# Patient Record
Sex: Female | Born: 1945
Health system: Southern US, Community
[De-identification: ages and names within clinical notes are randomized; demographics above are authoritative.]

## PROBLEM LIST (undated history)

## (undated) DIAGNOSIS — M810 Age-related osteoporosis without current pathological fracture: Secondary | ICD-10-CM

## (undated) DIAGNOSIS — M1990 Unspecified osteoarthritis, unspecified site: Secondary | ICD-10-CM

## (undated) DIAGNOSIS — R011 Cardiac murmur, unspecified: Secondary | ICD-10-CM

## (undated) DIAGNOSIS — I10 Essential (primary) hypertension: Secondary | ICD-10-CM

## (undated) DIAGNOSIS — E785 Hyperlipidemia, unspecified: Secondary | ICD-10-CM

## (undated) DIAGNOSIS — R7611 Nonspecific reaction to tuberculin skin test without active tuberculosis: Secondary | ICD-10-CM

## (undated) DIAGNOSIS — J45909 Unspecified asthma, uncomplicated: Secondary | ICD-10-CM

## (undated) DIAGNOSIS — E039 Hypothyroidism, unspecified: Secondary | ICD-10-CM

## (undated) DIAGNOSIS — B019 Varicella without complication: Secondary | ICD-10-CM

## (undated) HISTORY — DX: Nonspecific reaction to tuberculin skin test without active tuberculosis: R76.11

## (undated) HISTORY — DX: Essential (primary) hypertension: I10

## (undated) HISTORY — PX: BREAST BIOPSY: SHX20

## (undated) HISTORY — DX: Hyperlipidemia, unspecified: E78.5

## (undated) HISTORY — DX: Cardiac murmur, unspecified: R01.1

## (undated) HISTORY — DX: Age-related osteoporosis without current pathological fracture: M81.0

## (undated) HISTORY — DX: Hypothyroidism, unspecified: E03.9

## (undated) HISTORY — DX: Unspecified asthma, uncomplicated: J45.909

## (undated) HISTORY — DX: Unspecified osteoarthritis, unspecified site: M19.90

## (undated) HISTORY — DX: Varicella without complication: B01.9

---

## 2000-09-26 HISTORY — PX: FOOT SURGERY: SHX648

## 2014-07-10 LAB — HM DIABETES EYE EXAM

## 2015-01-12 LAB — HM COLONOSCOPY

## 2015-08-14 LAB — HM DIABETES EYE EXAM

## 2016-10-13 DIAGNOSIS — E039 Hypothyroidism, unspecified: Secondary | ICD-10-CM | POA: Diagnosis not present

## 2016-10-13 DIAGNOSIS — I10 Essential (primary) hypertension: Secondary | ICD-10-CM | POA: Diagnosis not present

## 2016-10-13 DIAGNOSIS — E7800 Pure hypercholesterolemia, unspecified: Secondary | ICD-10-CM | POA: Diagnosis not present

## 2016-10-13 DIAGNOSIS — E119 Type 2 diabetes mellitus without complications: Secondary | ICD-10-CM | POA: Diagnosis not present

## 2016-10-27 DIAGNOSIS — E7800 Pure hypercholesterolemia, unspecified: Secondary | ICD-10-CM | POA: Diagnosis not present

## 2016-10-27 DIAGNOSIS — Z79899 Other long term (current) drug therapy: Secondary | ICD-10-CM | POA: Diagnosis not present

## 2016-10-27 DIAGNOSIS — E119 Type 2 diabetes mellitus without complications: Secondary | ICD-10-CM | POA: Diagnosis not present

## 2016-11-17 DIAGNOSIS — E119 Type 2 diabetes mellitus without complications: Secondary | ICD-10-CM | POA: Diagnosis not present

## 2016-11-17 DIAGNOSIS — H25813 Combined forms of age-related cataract, bilateral: Secondary | ICD-10-CM | POA: Diagnosis not present

## 2016-11-17 LAB — HM DIABETES EYE EXAM

## 2016-12-26 DIAGNOSIS — L820 Inflamed seborrheic keratosis: Secondary | ICD-10-CM | POA: Diagnosis not present

## 2016-12-26 DIAGNOSIS — Z08 Encounter for follow-up examination after completed treatment for malignant neoplasm: Secondary | ICD-10-CM | POA: Diagnosis not present

## 2016-12-26 DIAGNOSIS — L814 Other melanin hyperpigmentation: Secondary | ICD-10-CM | POA: Diagnosis not present

## 2016-12-26 DIAGNOSIS — D2371 Other benign neoplasm of skin of right lower limb, including hip: Secondary | ICD-10-CM | POA: Diagnosis not present

## 2016-12-26 DIAGNOSIS — L821 Other seborrheic keratosis: Secondary | ICD-10-CM | POA: Diagnosis not present

## 2016-12-26 DIAGNOSIS — L538 Other specified erythematous conditions: Secondary | ICD-10-CM | POA: Diagnosis not present

## 2016-12-26 DIAGNOSIS — R58 Hemorrhage, not elsewhere classified: Secondary | ICD-10-CM | POA: Diagnosis not present

## 2017-01-24 DIAGNOSIS — Z1231 Encounter for screening mammogram for malignant neoplasm of breast: Secondary | ICD-10-CM | POA: Diagnosis not present

## 2017-03-27 DIAGNOSIS — E7800 Pure hypercholesterolemia, unspecified: Secondary | ICD-10-CM | POA: Diagnosis not present

## 2017-03-27 DIAGNOSIS — Z79899 Other long term (current) drug therapy: Secondary | ICD-10-CM | POA: Diagnosis not present

## 2017-03-27 DIAGNOSIS — E119 Type 2 diabetes mellitus without complications: Secondary | ICD-10-CM | POA: Diagnosis not present

## 2017-03-27 DIAGNOSIS — E039 Hypothyroidism, unspecified: Secondary | ICD-10-CM | POA: Diagnosis not present

## 2017-03-27 DIAGNOSIS — I10 Essential (primary) hypertension: Secondary | ICD-10-CM | POA: Diagnosis not present

## 2017-03-27 DIAGNOSIS — Z6841 Body Mass Index (BMI) 40.0 and over, adult: Secondary | ICD-10-CM | POA: Diagnosis not present

## 2017-03-27 LAB — LIPID PANEL
Cholesterol: 179 (ref 0–200)
HDL: 60 (ref 35–70)
LDL Cholesterol: 76
Triglycerides: 214 — AB (ref 40–160)

## 2017-03-27 LAB — HEPATIC FUNCTION PANEL
ALT: 26 (ref 7–35)
AST: 19 (ref 13–35)
Alkaline Phosphatase: 87 (ref 25–125)
Bilirubin, Total: 0.2

## 2017-03-27 LAB — BASIC METABOLIC PANEL
BUN: 37 — AB (ref 4–21)
CREATININE: 1.1 (ref 0.5–1.1)
GLUCOSE: 156
Potassium: 4.4 (ref 3.4–5.3)
SODIUM: 141 (ref 137–147)

## 2017-03-27 LAB — CBC AND DIFFERENTIAL
HCT: 37 (ref 36–46)
HEMOGLOBIN: 11.7 — AB (ref 12.0–16.0)
NEUTROS ABS: 6
Platelets: 428 — AB (ref 150–399)
WBC: 9.5

## 2017-03-27 LAB — HEMOGLOBIN A1C: HEMOGLOBIN A1C: 7.4

## 2017-03-27 LAB — TSH: TSH: 0.68 (ref 0.41–5.90)

## 2017-04-04 DIAGNOSIS — M8580 Other specified disorders of bone density and structure, unspecified site: Secondary | ICD-10-CM | POA: Diagnosis not present

## 2017-04-04 DIAGNOSIS — Z0000 Encounter for general adult medical examination without abnormal findings: Secondary | ICD-10-CM | POA: Diagnosis not present

## 2017-04-04 DIAGNOSIS — Z1212 Encounter for screening for malignant neoplasm of rectum: Secondary | ICD-10-CM | POA: Diagnosis not present

## 2017-04-04 DIAGNOSIS — N952 Postmenopausal atrophic vaginitis: Secondary | ICD-10-CM | POA: Diagnosis not present

## 2017-05-03 ENCOUNTER — Ambulatory Visit (INDEPENDENT_AMBULATORY_CARE_PROVIDER_SITE_OTHER): Payer: Medicare Other | Admitting: Adult Health

## 2017-05-03 ENCOUNTER — Encounter: Payer: Self-pay | Admitting: Adult Health

## 2017-05-03 VITALS — BP 128/66 | Temp 98.6°F | Ht 62.25 in | Wt 254.0 lb

## 2017-05-03 DIAGNOSIS — Z7689 Persons encountering health services in other specified circumstances: Secondary | ICD-10-CM

## 2017-05-03 DIAGNOSIS — B019 Varicella without complication: Secondary | ICD-10-CM | POA: Insufficient documentation

## 2017-05-03 DIAGNOSIS — I10 Essential (primary) hypertension: Secondary | ICD-10-CM | POA: Insufficient documentation

## 2017-05-03 DIAGNOSIS — E039 Hypothyroidism, unspecified: Secondary | ICD-10-CM | POA: Diagnosis not present

## 2017-05-03 DIAGNOSIS — E785 Hyperlipidemia, unspecified: Secondary | ICD-10-CM | POA: Insufficient documentation

## 2017-05-03 DIAGNOSIS — E782 Mixed hyperlipidemia: Secondary | ICD-10-CM

## 2017-05-03 DIAGNOSIS — M810 Age-related osteoporosis without current pathological fracture: Secondary | ICD-10-CM | POA: Insufficient documentation

## 2017-05-03 DIAGNOSIS — R7611 Nonspecific reaction to tuberculin skin test without active tuberculosis: Secondary | ICD-10-CM | POA: Insufficient documentation

## 2017-05-03 DIAGNOSIS — R011 Cardiac murmur, unspecified: Secondary | ICD-10-CM | POA: Insufficient documentation

## 2017-05-03 NOTE — Progress Notes (Signed)
Patient presents to clinic today to establish care. She is a pleasant 71 year old female who  has a past medical history of Arthritis; Asthma; Chicken pox; Heart murmur; Hyperlipidemia; Hypertension; Hypothyroidism; Osteoporosis; and Positive TB test.  Her last physical was in July 2018.   She has recently moved from [place] with her husband, whom she is the the main caregiver for. She moved so that they could be closer to her son.    Acute Concerns: Establish Care    Chronic Issues: Diabetes - last A1c was 7.4. She currently takes Glipizide 10 mg and Metformin 1000mg  BID   Hyperlipidemia - Controlled with Lipitor  10 mg   Hypertension - She takes lisinopril/HCTZ 20-25mg  and Norvasc 10 mg daily. She is well controlled on this combination   Hypothyroidism - Is controlled on 8 53mcg   Health Maintenance: Dental --Routine Care Vision --Routine Care Immunizations -- UTD  Colonoscopy -- 09/2014 - 5 year plan  Mammogram -- 01/24/2017  PAP -- 7/17  Bone Density -- ? Diet: Tries to eat healthy  Exercise: She is the main caregiver of her husband with Alzheimer's so she does not get a lot of time to exercise    Past Medical History:  Diagnosis Date  . Arthritis   . Asthma   . Chicken pox   . Heart murmur   . Hyperlipidemia   . Hypertension   . Hypothyroidism   . Osteoporosis   . Positive TB test     Past Surgical History:  Procedure Laterality Date  . FOOT SURGERY Left 2002    No current outpatient prescriptions on file prior to visit.   No current facility-administered medications on file prior to visit.     Allergies  Allergen Reactions  . Codeine Nausea And Vomiting    Family History  Problem Relation Age of Onset  . Heart disease Mother   . Stroke Mother   . Hyperlipidemia Mother   . Pulmonary fibrosis Father     Social History   Social History  . Marital status: Married    Spouse name: N/A  . Number of children: N/A  . Years of education: N/A    Occupational History  . Not on file.   Social History Main Topics  . Smoking status: Never Smoker  . Smokeless tobacco: Never Used  . Alcohol use No  . Drug use: No  . Sexual activity: Not on file   Other Topics Concern  . Not on file   Social History Narrative   Retired Therapist, sports        Review of Systems  Constitutional: Negative.   HENT: Negative.   Eyes: Negative.   Respiratory: Negative.   Cardiovascular: Negative.   Gastrointestinal: Negative.   Genitourinary: Negative.   Musculoskeletal: Negative.   Skin: Negative.   Neurological: Negative.   Endo/Heme/Allergies: Negative.   Psychiatric/Behavioral: Negative.   All other systems reviewed and are negative.   BP 128/66 (BP Location: Right Arm)   Temp 98.6 F (37 C) (Oral)   Ht 5' 2.25" (1.581 m)   Wt 254 lb (115.2 kg)   BMI 46.08 kg/m   Physical Exam  Constitutional: She is oriented to person, place, and time and well-developed, well-nourished, and in no distress. No distress.  Obese   Cardiovascular: Normal rate, regular rhythm and intact distal pulses.  Exam reveals no gallop and no friction rub.   Murmur (heard best at  right sternal border ) heard. Pulmonary/Chest: Effort normal and  breath sounds normal. No respiratory distress. She has no wheezes. She has no rales. She exhibits no tenderness.  Neurological: She is alert and oriented to person, place, and time. Gait normal. GCS score is 15.  Skin: Skin is warm and dry. No rash noted. She is not diaphoretic. No erythema. No pallor.  Psychiatric: Mood, memory, affect and judgment normal.  Nursing note and vitals reviewed.  Assessment/Plan:  1. Encounter to establish care - Follow up for CPE in July 2019  - Encouraged to start exercising and following a diabetic diet.  - Follow up sooner for any acute issues.   2. Hypothyroidism, unspecified type - Well controlled. No change in medication at this time   3. Essential hypertension - Well controlled.  -  No change in medication at this time   4. Mixed hyperlipidemia - Continue with Lipitor  - Will retest at [place], NP

## 2017-05-03 NOTE — Patient Instructions (Signed)
It was a pleasure meeting you today   Please follow up with me in July for your physical and in 3 months for a diabetes check up.   If you need anything in the meantime, please let me know

## 2017-05-09 ENCOUNTER — Encounter: Payer: Self-pay | Admitting: Family Medicine

## 2017-05-18 ENCOUNTER — Encounter: Payer: Self-pay | Admitting: Family Medicine

## 2017-08-02 ENCOUNTER — Encounter: Payer: Self-pay | Admitting: Adult Health

## 2017-08-02 ENCOUNTER — Ambulatory Visit (INDEPENDENT_AMBULATORY_CARE_PROVIDER_SITE_OTHER): Payer: Medicare Other | Admitting: Adult Health

## 2017-08-02 VITALS — BP 138/72 | Temp 98.3°F | Wt 250.0 lb

## 2017-08-02 DIAGNOSIS — Z760 Encounter for issue of repeat prescription: Secondary | ICD-10-CM | POA: Diagnosis not present

## 2017-08-02 DIAGNOSIS — E119 Type 2 diabetes mellitus without complications: Secondary | ICD-10-CM

## 2017-08-02 LAB — POCT GLYCOSYLATED HEMOGLOBIN (HGB A1C): HEMOGLOBIN A1C: 6.9

## 2017-08-02 MED ORDER — METFORMIN HCL 1000 MG PO TABS: 1000.0000 mg | ORAL_TABLET | Freq: Two times a day (BID) | ORAL | 1 refills | Status: DC

## 2017-08-02 MED ORDER — ALBUTEROL SULFATE HFA 108 (90 BASE) MCG/ACT IN AERS: 1.0000 | INHALATION_SPRAY | Freq: Four times a day (QID) | RESPIRATORY_TRACT | 3 refills | Status: DC | PRN

## 2017-08-02 NOTE — Progress Notes (Signed)
Subjective:    Patient ID: Caroline Suarez, female    DOB: [birth year], 71 y.o.   MRN: [id number]  HPI  71 year old female who  has a past medical history of Arthritis, Asthma, Chicken pox, Heart murmur, Hyperlipidemia, Hypertension, Hypothyroidism, Osteoporosis, and Positive TB test. She presents to the office today for follow up regarding diabetes mellitus. Her last A1c in July 2018 was 7.4. She is currently maintained on glipizide 10 mg and metformin 1000 mg BID   She does not check her blood sugars at home. She has been working on her diet   She has no acute issues today   Review of Systems See HPI   Past Medical History:  Diagnosis Date  . Arthritis   . Asthma   . Chicken pox   . Heart murmur   . Hyperlipidemia   . Hypertension   . Hypothyroidism   . Osteoporosis   . Positive TB test     Social History   Socioeconomic History  . Marital status: Married    Spouse name: Not on file  . Number of children: Not on file  . Years of education: Not on file  . Highest education level: Not on file  Social Needs  . Financial resource strain: Not on file  . Food insecurity - worry: Not on file  . Food insecurity - inability: Not on file  . Transportation needs - medical: Not on file  . Transportation needs - non-medical: Not on file  Occupational History  . Not on file  Tobacco Use  . Smoking status: Never Smoker  . Smokeless tobacco: Never Used  Substance and Sexual Activity  . Alcohol use: No  . Drug use: No  . Sexual activity: Not on file  Other Topics Concern  . Not on file  Social History Narrative   Retired Therapist, sports     Past Surgical History:  Procedure Laterality Date  . BREAST BIOPSY     multiple. None came back positive   . FOOT SURGERY Left 2002    Family History  Problem Relation Age of Onset  . Heart disease Mother   . Stroke Mother   . Hyperlipidemia Mother   . Pulmonary fibrosis Father     Allergies  Allergen Reactions  . Codeine Nausea And  Vomiting    Current Outpatient Medications on File Prior to Visit  Medication Sig Dispense Refill  . Albuterol Sulfate (PROAIR HFA IN) Inhale into the lungs.    Marland Kitchen alendronate (FOSAMAX) 70 MG tablet Take 70 mg by mouth once a week. Take with a full glass of water on an empty stomach.    Marland Kitchen amLODipine (NORVASC) 10 MG tablet Take 10 mg by mouth daily.    Marland Kitchen aspirin EC 81 MG tablet Take 81 mg by mouth daily.    Marland Kitchen atorvastatin (LIPITOR) 10 MG tablet Take 10 mg by mouth at bedtime.    . fexofenadine (ALLEGRA) 180 MG tablet Take 180 mg by mouth daily.    Marland Kitchen glipiZIDE (GLUCOTROL) 10 MG tablet Take 10 mg by mouth 2 (two) times daily before a meal.    . levothyroxine (SYNTHROID, LEVOTHROID) 88 MCG tablet Take 88 mcg by mouth daily before breakfast.    . lisinopril-hydrochlorothiazide (PRINZIDE,ZESTORETIC) 20-25 MG tablet Take 1 tablet by mouth daily.    . metFORMIN (GLUCOPHAGE) 1000 MG tablet Take 1,000 mg by mouth 2 (two) times daily with a meal.    . thiamine 100 MG tablet Take 100 mg  by mouth daily.    . valACYclovir (VALTREX) 1000 MG tablet TK 2 TS PO BID  3   No current facility-administered medications on file prior to visit.     BP 138/72 (BP Location: Left Arm)   Temp 98.3 F (36.8 C) (Oral)   Wt 250 lb (113.4 kg)   BMI 45.36 kg/m       Objective:   Physical Exam  Constitutional: She is oriented to person, place, and time. She appears well-developed and well-nourished. No distress.  Cardiovascular: Normal rate, regular rhythm, normal heart sounds and intact distal pulses. Exam reveals no gallop and no friction rub.  No murmur heard. Pulmonary/Chest: Effort normal and breath sounds normal. No respiratory distress. She has no wheezes. She has no rales. She exhibits no tenderness.  Neurological: She is alert and oriented to person, place, and time.  Skin: Skin is warm and dry. No rash noted. She is not diaphoretic. No erythema. No pallor.  Psychiatric: She has a normal mood and affect.  Her behavior is normal. Judgment and thought content normal.  Nursing note and vitals reviewed.     Assessment & Plan:  1. Type 2 diabetes mellitus without complication, without long-term current use of insulin (HCC)  - metFORMIN (GLUCOPHAGE) 1000 MG tablet; Take 1 tablet (1,000 mg total) 2 (two) times daily with a meal by mouth.  Dispense: 180 tablet; Refill: 1 - POC HgB A1c - 6.9 . Has improved from 7.4 - No change in medication at this time.  - I am ok with her following up in July for her physical exam unless she runs into any problems   2. Medication refill  - albuterol (PROAIR HFA) 108 (90 Base) MCG/ACT inhaler; Inhale 1-2 puffs every 6 (six) hours as needed into the lungs.  Dispense: 6.7 g; Refill: 3

## 2017-10-10 DIAGNOSIS — H25813 Combined forms of age-related cataract, bilateral: Secondary | ICD-10-CM | POA: Diagnosis not present

## 2017-12-11 DIAGNOSIS — D225 Melanocytic nevi of trunk: Secondary | ICD-10-CM | POA: Diagnosis not present

## 2017-12-11 DIAGNOSIS — D0462 Carcinoma in situ of skin of left upper limb, including shoulder: Secondary | ICD-10-CM | POA: Diagnosis not present

## 2017-12-11 DIAGNOSIS — L821 Other seborrheic keratosis: Secondary | ICD-10-CM | POA: Diagnosis not present

## 2017-12-11 DIAGNOSIS — L814 Other melanin hyperpigmentation: Secondary | ICD-10-CM | POA: Diagnosis not present

## 2017-12-11 DIAGNOSIS — Z85828 Personal history of other malignant neoplasm of skin: Secondary | ICD-10-CM | POA: Diagnosis not present

## 2017-12-22 DIAGNOSIS — D0462 Carcinoma in situ of skin of left upper limb, including shoulder: Secondary | ICD-10-CM | POA: Diagnosis not present

## 2017-12-22 DIAGNOSIS — Z85828 Personal history of other malignant neoplasm of skin: Secondary | ICD-10-CM | POA: Diagnosis not present

## 2018-02-28 ENCOUNTER — Other Ambulatory Visit: Payer: Self-pay | Admitting: Adult Health

## 2018-02-28 DIAGNOSIS — E119 Type 2 diabetes mellitus without complications: Secondary | ICD-10-CM

## 2018-02-28 NOTE — Telephone Encounter (Signed)
Ok to refill for 90 days, but needs office visit to check her A1c

## 2018-03-01 NOTE — Telephone Encounter (Signed)
Sent to the pharmacy as instructed.  Left a detailed message on voicemail instructing the pt to call back and schedule A1C follow up.

## 2018-03-16 ENCOUNTER — Other Ambulatory Visit: Payer: Self-pay | Admitting: Adult Health

## 2018-03-16 NOTE — Telephone Encounter (Signed)
Sent to the pharmacy by e-scribe. 

## 2018-03-16 NOTE — Telephone Encounter (Signed)
Caroline Suarez, you have not prescribed either of these medications in the past.  Please advise.

## 2018-03-16 NOTE — Telephone Encounter (Signed)
Ok to refill 90 +1  

## 2018-03-27 ENCOUNTER — Other Ambulatory Visit: Payer: Self-pay | Admitting: Adult Health

## 2018-03-27 NOTE — Telephone Encounter (Signed)
Sent to the pharmacy by e-scribe for 90 days.  Pt has upcoming appt on 04/03/18.

## 2018-04-03 ENCOUNTER — Ambulatory Visit (INDEPENDENT_AMBULATORY_CARE_PROVIDER_SITE_OTHER): Payer: Medicare Other

## 2018-04-03 ENCOUNTER — Ambulatory Visit (INDEPENDENT_AMBULATORY_CARE_PROVIDER_SITE_OTHER): Payer: Medicare Other | Admitting: Adult Health

## 2018-04-03 ENCOUNTER — Encounter: Payer: Self-pay | Admitting: Adult Health

## 2018-04-03 VITALS — BP 120/76 | Ht 62.0 in | Wt 243.0 lb

## 2018-04-03 VITALS — BP 120/76 | Temp 98.2°F | Ht 62.0 in | Wt 243.0 lb

## 2018-04-03 DIAGNOSIS — I10 Essential (primary) hypertension: Secondary | ICD-10-CM

## 2018-04-03 DIAGNOSIS — F4321 Adjustment disorder with depressed mood: Secondary | ICD-10-CM

## 2018-04-03 DIAGNOSIS — Z0000 Encounter for general adult medical examination without abnormal findings: Secondary | ICD-10-CM | POA: Diagnosis not present

## 2018-04-03 DIAGNOSIS — Z1159 Encounter for screening for other viral diseases: Secondary | ICD-10-CM | POA: Diagnosis not present

## 2018-04-03 DIAGNOSIS — E119 Type 2 diabetes mellitus without complications: Secondary | ICD-10-CM | POA: Diagnosis not present

## 2018-04-03 DIAGNOSIS — E039 Hypothyroidism, unspecified: Secondary | ICD-10-CM

## 2018-04-03 DIAGNOSIS — Z23 Encounter for immunization: Secondary | ICD-10-CM | POA: Diagnosis not present

## 2018-04-03 DIAGNOSIS — E782 Mixed hyperlipidemia: Secondary | ICD-10-CM | POA: Diagnosis not present

## 2018-04-03 DIAGNOSIS — Z114 Encounter for screening for human immunodeficiency virus [HIV]: Secondary | ICD-10-CM | POA: Diagnosis not present

## 2018-04-03 LAB — CBC WITH DIFFERENTIAL/PLATELET
BASOS PCT: 1 % (ref 0.0–3.0)
Basophils Absolute: 0.1 10*3/uL (ref 0.0–0.1)
EOS PCT: 1.6 % (ref 0.0–5.0)
Eosinophils Absolute: 0.2 10*3/uL (ref 0.0–0.7)
HEMATOCRIT: 35.8 % — AB (ref 36.0–46.0)
HEMOGLOBIN: 11.9 g/dL — AB (ref 12.0–15.0)
LYMPHS PCT: 24.6 % (ref 12.0–46.0)
Lymphs Abs: 2.3 10*3/uL (ref 0.7–4.0)
MCHC: 33.4 g/dL (ref 30.0–36.0)
MCV: 93.3 fl (ref 78.0–100.0)
MONOS PCT: 5.6 % (ref 3.0–12.0)
Monocytes Absolute: 0.5 10*3/uL (ref 0.1–1.0)
Neutro Abs: 6.4 10*3/uL (ref 1.4–7.7)
Neutrophils Relative %: 67.2 % (ref 43.0–77.0)
Platelets: 447 10*3/uL — ABNORMAL HIGH (ref 150.0–400.0)
RBC: 3.84 Mil/uL — ABNORMAL LOW (ref 3.87–5.11)
RDW: 13.8 % (ref 11.5–15.5)
WBC: 9.5 10*3/uL (ref 4.0–10.5)

## 2018-04-03 LAB — BASIC METABOLIC PANEL
BUN: 32 mg/dL — ABNORMAL HIGH (ref 6–23)
CHLORIDE: 106 meq/L (ref 96–112)
CO2: 26 mEq/L (ref 19–32)
Calcium: 10 mg/dL (ref 8.4–10.5)
Creatinine, Ser: 1.04 mg/dL (ref 0.40–1.20)
GFR: 55.35 mL/min — ABNORMAL LOW (ref >60.00)
Glucose, Bld: 140 mg/dL — ABNORMAL HIGH (ref 70–99)
POTASSIUM: 4.8 meq/L (ref 3.5–5.1)
Sodium: 141 mEq/L (ref 135–145)

## 2018-04-03 LAB — HEMOGLOBIN A1C: HEMOGLOBIN A1C: 7.1 % — AB (ref 4.6–6.5)

## 2018-04-03 LAB — HEPATIC FUNCTION PANEL
ALT: 15 U/L (ref 0–35)
AST: 14 U/L (ref 0–37)
Albumin: 4.3 g/dL (ref 3.5–5.2)
Alkaline Phosphatase: 72 U/L (ref 39–117)
BILIRUBIN DIRECT: 0.1 mg/dL (ref 0.0–0.3)
TOTAL PROTEIN: 7.2 g/dL (ref 6.0–8.3)
Total Bilirubin: 0.3 mg/dL (ref 0.2–1.2)

## 2018-04-03 LAB — LIPID PANEL
CHOL/HDL RATIO: 3
CHOLESTEROL: 203 mg/dL — AB (ref 0–200)
HDL: 59.8 mg/dL (ref >39.00)
LDL Cholesterol: 111 mg/dL — ABNORMAL HIGH (ref 0–99)
NonHDL: 142.9
Triglycerides: 160 mg/dL — ABNORMAL HIGH (ref 0.0–149.0)
VLDL: 32 mg/dL (ref 0.0–40.0)

## 2018-04-03 LAB — TSH: TSH: 0.9 u[IU]/mL (ref 0.35–4.50)

## 2018-04-03 NOTE — Patient Instructions (Addendum)
Caroline Suarez , Thank you for taking time to come for your Medicare Wellness Visit. I appreciate your ongoing commitment to your health goals. Please review the following plan we discussed and let me know if I can assist you in the future.   You can ask your OB GYN doctor dr. Julien Girt if they are following your Dexa or bone density  You bone density was done July 2016; and started on medication the same year  Gyn will fup mammogram to be done on Friday  [facility] - can assist with hearing aid x 1  No reviews  [facility]  [id number]  [phone]  [email or web address]    These are the goals we discussed: Goals    . Exercise 150 min/wk Moderate Activity     Join water aerobics class !        This is a list of the screening recommended for you and due dates:  Health Maintenance  Topic Date Due  .  Hepatitis C: One time screening is recommended by Center for Disease Control  (CDC) for  adults born from [birth year] through 1965.   [birth year]  . Complete foot exam   02/28/1956  . Hemoglobin A1C  01/30/2018  . Flu Shot  04/26/2018  . Eye exam for diabetics  09/26/2018  . Mammogram  01/25/2019  . Tetanus Vaccine  10/12/2022  . Colon Cancer Screening  10/23/2024  . DEXA scan (bone density measurement)  Completed  . Pneumonia vaccines  Completed   '  Fall Prevention in the Home Falls can cause injuries. They can happen to people of all ages. There are many things you can do to make your home safe and to help prevent falls. What can I do on the outside of my home?  Regularly fix the edges of walkways and driveways and fix any cracks.  Remove anything that might make you trip as you walk through a door, such as a raised step or threshold.  Trim any bushes or trees on the path to your home.  Use bright outdoor lighting.  Clear any walking paths  of anything that might make someone trip, such as rocks or tools.  Regularly check to see if handrails are loose or broken. Make sure that both sides of any steps have handrails.  Any raised decks and porches should have guardrails on the edges.  Have any leaves, snow, or ice cleared regularly.  Use sand or salt on walking paths during winter.  Clean up any spills in your garage right away. This includes oil or grease spills. What can I do in the bathroom?  Use night lights.  Install grab bars by the toilet and in the tub and shower. Do not use towel bars as grab bars.  Use non-skid mats or decals in the tub or shower.  If you need to sit down in the shower, use a plastic, non-slip stool.  Keep the floor dry. Clean up any water that spills on the floor as soon as it happens.  Remove soap buildup in the tub or shower regularly.  Attach bath mats securely with double-sided non-slip rug tape.  Do not have throw rugs and other things on the floor that can make you trip. What can I do in the bedroom?  Use night lights.  Make sure that you have a light by your bed that is easy to reach.  Do not use any sheets or blankets  that are too big for your bed. They should not hang down onto the floor.  Have a firm chair that has side arms. You can use this for support while you get dressed.  Do not have throw rugs and other things on the floor that can make you trip. What can I do in the kitchen?  Clean up any spills right away.  Avoid walking on wet floors.  Keep items that you use a lot in easy-to-reach places.  If you need to reach something above you, use a strong step stool that has a grab bar.  Keep electrical cords out of the way.  Do not use floor polish or wax that makes floors slippery. If you must use wax, use non-skid floor wax.  Do not have throw rugs and other things on the floor that can make you trip. What can I do with my stairs?  Do not leave any items on  the stairs.  Make sure that there are handrails on both sides of the stairs and use them. Fix handrails that are broken or loose. Make sure that handrails are as long as the stairways.  Check any carpeting to make sure that it is firmly attached to the stairs. Fix any carpet that is loose or worn.  Avoid having throw rugs at the top or bottom of the stairs. If you do have throw rugs, attach them to the floor with carpet tape.  Make sure that you have a light switch at the top of the stairs and the bottom of the stairs. If you do not have them, ask someone to add them for you. What else can I do to help prevent falls?  Wear shoes that: ? Do not have high heels. ? Have rubber bottoms. ? Are comfortable and fit you well. ? Are closed at the toe. Do not wear sandals.  If you use a stepladder: ? Make sure that it is fully opened. Do not climb a closed stepladder. ? Make sure that both sides of the stepladder are locked into place. ? Ask someone to hold it for you, if possible.  Clearly mark and make sure that you can see: ? Any grab bars or handrails. ? First and last steps. ? Where the edge of each step is.  Use tools that help you move around (mobility aids) if they are needed. These include: ? Canes. ? Walkers. ? Scooters. ? Crutches.  Turn on the lights when you go into a dark area. Replace any light bulbs as soon as they burn out.  Set up your furniture so you have a clear path. Avoid moving your furniture around.  If any of your floors are uneven, fix them.  If there are any pets around you, be aware of where they are.  Review your medicines with your doctor. Some medicines can make you feel dizzy. This can increase your chance of falling. Ask your doctor what other things that you can do to help prevent falls. This information is not intended to replace advice given to you by your health care provider. Make sure you discuss any questions you have with your health care  provider. Document Released: 07/09/2009 Document Revised: 02/18/2016 Document Reviewed: 10/17/2014 Elsevier Interactive Patient Education  2018 [place] Maintenance, Female Adopting a healthy lifestyle and getting preventive care can go a long way to promote health and wellness. Talk with your health care provider about what schedule of regular examinations is right for you. This  is a good chance for you to check in with your provider about disease prevention and staying healthy. In between checkups, there are plenty of things you can do on your own. Experts have done a lot of research about which lifestyle changes and preventive measures are most likely to keep you healthy. Ask your health care provider for more information. Weight and diet Eat a healthy diet  Be sure to include plenty of vegetables, fruits, low-fat dairy products, and lean protein.  Do not eat a lot of foods high in solid fats, added sugars, or salt.  Get regular exercise. This is one of the most important things you can do for your health. ? Most adults should exercise for at least 150 minutes each week. The exercise should increase your heart rate and make you sweat (moderate-intensity exercise). ? Most adults should also do strengthening exercises at least twice a week. This is in addition to the moderate-intensity exercise.  Maintain a healthy weight  Body mass index (BMI) is a measurement that can be used to identify possible weight problems. It estimates body fat based on height and weight. Your health care provider can help determine your BMI and help you achieve or maintain a healthy weight.  For females 83 years of age and older: ? A BMI below 18.5 is considered underweight. ? A BMI of 18.5 to 24.9 is normal. ? A BMI of 25 to 29.9 is considered overweight. ? A BMI of 30 and above is considered obese.  Watch levels of cholesterol and blood lipids  You should start having your blood tested for  lipids and cholesterol at 72 years of age, then have this test every 5 years.  You may need to have your cholesterol levels checked more often if: ? Your lipid or cholesterol levels are high. ? You are older than 72 years of age. ? You are at high risk for heart disease.  Cancer screening Lung Cancer  Lung cancer screening is recommended for adults 59-75 years old who are at high risk for lung cancer because of a history of smoking.  A yearly low-dose CT scan of the lungs is recommended for people who: ? Currently smoke. ? Have quit within the past 15 years. ? Have at least a 30-pack-year history of smoking. A pack year is smoking an average of one pack of cigarettes a day for 1 year.  Yearly screening should continue until it has been 15 years since you quit.  Yearly screening should stop if you develop a health problem that would prevent you from having lung cancer treatment.  Breast Cancer  Practice breast self-awareness. This means understanding how your breasts normally appear and feel.  It also means doing regular breast self-exams. Let your health care provider know about any changes, no matter how small.  If you are in your 20s or 30s, you should have a clinical breast exam (CBE) by a health care provider every 1-3 years as part of a regular health exam.  If you are 72 or older, have a CBE every year. Also consider having a breast X-ray (mammogram) every year.  If you have a family history of breast cancer, talk to your health care provider about genetic screening.  If you are at high risk for breast cancer, talk to your health care provider about having an MRI and a mammogram every year.  Breast cancer gene (BRCA) assessment is recommended for women who have family members with BRCA-related cancers. BRCA-related cancers include: ?  Breast. ? Ovarian. ? Tubal. ? Peritoneal cancers.  Results of the assessment will determine the need for genetic counseling and BRCA1 and  BRCA2 testing.  Cervical Cancer Your health care provider may recommend that you be screened regularly for cancer of the pelvic organs (ovaries, uterus, and vagina). This screening involves a pelvic examination, including checking for microscopic changes to the surface of your cervix (Pap test). You may be encouraged to have this screening done every 3 years, beginning at age 49.  For women ages 62-65, health care providers may recommend pelvic exams and Pap testing every 3 years, or they may recommend the Pap and pelvic exam, combined with testing for human papilloma virus (HPV), every 5 years. Some types of HPV increase your risk of cervical cancer. Testing for HPV may also be done on women of any age with unclear Pap test results.  Other health care providers may not recommend any screening for nonpregnant women who are considered low risk for pelvic cancer and who do not have symptoms. Ask your health care provider if a screening pelvic exam is right for you.  If you have had past treatment for cervical cancer or a condition that could lead to cancer, you need Pap tests and screening for cancer for at least 20 years after your treatment. If Pap tests have been discontinued, your risk factors (such as having a new sexual partner) need to be reassessed to determine if screening should resume. Some women have medical problems that increase the chance of getting cervical cancer. In these cases, your health care provider may recommend more frequent screening and Pap tests.  Colorectal Cancer  This type of cancer can be detected and often prevented.  Routine colorectal cancer screening usually begins at 71 years of age and continues through 72 years of age.  Your health care provider may recommend screening at an earlier age if you have risk factors for colon cancer.  Your health care provider may also recommend using home test kits to check for hidden blood in the stool.  A small camera at the  end of a tube can be used to examine your colon directly (sigmoidoscopy or colonoscopy). This is done to check for the earliest forms of colorectal cancer.  Routine screening usually begins at age 58.  Direct examination of the colon should be repeated every 5-10 years through 72 years of age. However, you may need to be screened more often if early forms of precancerous polyps or small growths are found.  Skin Cancer  Check your skin from head to toe regularly.  Tell your health care provider about any new moles or changes in moles, especially if there is a change in a mole's shape or color.  Also tell your health care provider if you have a mole that is larger than the size of a pencil eraser.  Always use sunscreen. Apply sunscreen liberally and repeatedly throughout the day.  Protect yourself by wearing long sleeves, pants, a wide-brimmed hat, and sunglasses whenever you are outside.  Heart disease, diabetes, and high blood pressure  High blood pressure causes heart disease and increases the risk of stroke. High blood pressure is more likely to develop in: ? People who have blood pressure in the high end of the normal range (130-139/85-89 mm Hg). ? People who are overweight or obese. ? People who are African American.  If you are 60-32 years of age, have your blood pressure checked every 3-5 years. If you are 40  years of age or older, have your blood pressure checked every year. You should have your blood pressure measured twice-once when you are at a hospital or clinic, and once when you are not at a hospital or clinic. Record the average of the two measurements. To check your blood pressure when you are not at a hospital or clinic, you can use: ? An automated blood pressure machine at a pharmacy. ? A home blood pressure monitor.  If you are between 59 years and 72 years old, ask your health care provider if you should take aspirin to prevent strokes.  Have regular diabetes  screenings. This involves taking a blood sample to check your fasting blood sugar level. ? If you are at a normal weight and have a low risk for diabetes, have this test once every three years after 72 years of age. ? If you are overweight and have a high risk for diabetes, consider being tested at a younger age or more often. Preventing infection Hepatitis B  If you have a higher risk for hepatitis B, you should be screened for this virus. You are considered at high risk for hepatitis B if: ? You were born in a country where hepatitis B is common. Ask your health care provider which countries are considered high risk. ? Your parents were born in a high-risk country, and you have not been immunized against hepatitis B (hepatitis B vaccine). ? You have HIV or AIDS. ? You use needles to inject street drugs. ? You live with someone who has hepatitis B. ? You have had sex with someone who has hepatitis B. ? You get hemodialysis treatment. ? You take certain medicines for conditions, including cancer, organ transplantation, and autoimmune conditions.  Hepatitis C  Blood testing is recommended for: ? Everyone born from [birth year] through 1965. ? Anyone with known risk factors for hepatitis C.  Sexually transmitted infections (STIs)  You should be screened for sexually transmitted infections (STIs) including gonorrhea and chlamydia if: ? You are sexually active and are younger than 72 years of age. ? You are older than 72 years of age and your health care provider tells you that you are at risk for this type of infection. ? Your sexual activity has changed since you were last screened and you are at an increased risk for chlamydia or gonorrhea. Ask your health care provider if you are at risk.  If you do not have HIV, but are at risk, it may be recommended that you take a prescription medicine daily to prevent HIV infection. This is called pre-exposure prophylaxis (PrEP). You are considered at risk  if: ? You are sexually active and do not regularly use condoms or know the HIV status of your partner(s). ? You take drugs by injection. ? You are sexually active with a partner who has HIV.  Talk with your health care provider about whether you are at high risk of being infected with HIV. If you choose to begin PrEP, you should first be tested for HIV. You should then be tested every 3 months for as long as you are taking PrEP. Pregnancy  If you are premenopausal and you may become pregnant, ask your health care provider about preconception counseling.  If you may become pregnant, take 400 to 800 micrograms (mcg) of folic acid every day.  If you want to prevent pregnancy, talk to your health care provider about birth control (contraception). Osteoporosis and menopause  Osteoporosis is a disease in which  the bones lose minerals and strength with aging. This can result in serious bone fractures. Your risk for osteoporosis can be identified using a bone density scan.  If you are 55 years of age or older, or if you are at risk for osteoporosis and fractures, ask your health care provider if you should be screened.  Ask your health care provider whether you should take a calcium or vitamin D supplement to lower your risk for osteoporosis.  Menopause may have certain physical symptoms and risks.  Hormone replacement therapy may reduce some of these symptoms and risks. Talk to your health care provider about whether hormone replacement therapy is right for you. Follow these instructions at home:  Schedule regular health, dental, and eye exams.  Stay current with your immunizations.  Do not use any tobacco products including cigarettes, chewing tobacco, or electronic cigarettes.  If you are pregnant, do not drink alcohol.  If you are breastfeeding, limit how much and how often you drink alcohol.  Limit alcohol intake to no more than 1 drink per day for nonpregnant women. One drink  equals 12 ounces of beer, 5 ounces of wine, or 1 ounces of hard liquor.  Do not use street drugs.  Do not share needles.  Ask your health care provider for help if you need support or information about quitting drugs.  Tell your health care provider if you often feel depressed.  Tell your health care provider if you have ever been abused or do not feel safe at home. This information is not intended to replace advice given to you by your health care provider. Make sure you discuss any questions you have with your health care provider. Document Released: 03/28/2011 Document Revised: 02/18/2016 Document Reviewed: 06/16/2015 Elsevier Interactive Patient Education  [employer].

## 2018-04-03 NOTE — Progress Notes (Signed)
Subjective:   Caroline Suarez is a 72 y.o. female who presents for Medicare Annual (Subsequent) preventive examination.  Reports health as good Seen 08/02/2017 by Dorothyann Peng Osteoporosis  Positive TB  Has grandchildren here Has 20 and 2 live here The oldest grandson was 2  When moved to [place] and now 1st year of college Recent loss of spouse  Retired ER nurse    Diet Triglycerides 214 A1c 6.9  Eating 3 meals Eats fruits and vegetables  Exercise Water aerobics    Diabetic eye exam 10/2016 - had Jan 2019 Mammogram 01/2017 - has had one yet - will do Friday at gyn Dexa 03/2015 -1/9 and -2.1 Is on Fosamax July 2016   Dr. Julien Girt - will most likely follow but the patient will discuss this Friday fx risk is 16%   She has had the shingrix   Health Maintenance Due  Topic Date Due  . Hepatitis C Screening  1946/02/02  . HEMOGLOBIN A1C  01/30/2018   Hepatitis C will check at blood draw Foot exam - Tommi Rumps completed her foot exam   Last psv was around 72 yo  Will discuss repeat  Eye exam  Jan 22nd 2020 Goes to Dr. Venetia Maxon   Cardiac Risk Factors include: advanced age (>5men, >52 women);diabetes mellitus;dyslipidemia;hypertension;obesity (BMI >30kg/m2)     Objective:     Vitals: BP 120/76   Ht 5\' 2"  (1.575 m)   Wt 243 lb (110.2 kg)   BMI 44.45 kg/m   Body mass index is 44.45 kg/m.  Advanced Directives 04/03/2018  Does Patient Have a Medical Advance Directive? Yes    Tobacco Social History   Tobacco Use  Smoking Status Never Smoker  Smokeless Tobacco Never Used     Counseling given: Yes   Clinical Intake:     Past Medical History:  Diagnosis Date  . Arthritis   . Asthma   . Chicken pox   . Heart murmur   . Hyperlipidemia   . Hypertension   . Hypothyroidism   . Osteoporosis   . Positive TB test    Past Surgical History:  Procedure Laterality Date  . BREAST BIOPSY     multiple. None came back positive   . FOOT SURGERY Left 2002   Family History   Problem Relation Age of Onset  . Heart disease Mother   . Stroke Mother   . Hyperlipidemia Mother   . Pulmonary fibrosis Father    Social History   Socioeconomic History  . Marital status: Married    Spouse name: Not on file  . Number of children: Not on file  . Years of education: Not on file  . Highest education level: Not on file  Occupational History  . Not on file  Social Needs  . Financial resource strain: Not on file  . Food insecurity:    Worry: Not on file    Inability: Not on file  . Transportation needs:    Medical: Not on file    Non-medical: Not on file  Tobacco Use  . Smoking status: Never Smoker  . Smokeless tobacco: Never Used  Substance and Sexual Activity  . Alcohol use: No  . Drug use: No  . Sexual activity: Not on file  Lifestyle  . Physical activity:    Days per week: Not on file    Minutes per session: Not on file  . Stress: Not on file  Relationships  . Social connections:    Talks on phone: Not  on file    Gets together: Not on file    Attends religious service: Not on file    Active member of club or organization: Not on file    Attends meetings of clubs or organizations: Not on file    Relationship status: Not on file  Other Topics Concern  . Not on file  Social History Narrative   Retired Therapist, sports     Outpatient Encounter Medications as of 04/03/2018  Medication Sig  . albuterol (PROAIR HFA) 108 (90 Base) MCG/ACT inhaler Inhale 1-2 puffs every 6 (six) hours as needed into the lungs.  Marland Kitchen alendronate (FOSAMAX) 70 MG tablet Take 70 mg by mouth once a week. Take with a full glass of water on an empty stomach.  Marland Kitchen amLODipine (NORVASC) 10 MG tablet TAKE 1 TABLET BY MOUTH DAILY  . aspirin EC 81 MG tablet Take 81 mg by mouth daily.  Marland Kitchen atorvastatin (LIPITOR) 10 MG tablet TAKE 1 TABLET BY MOUTH DAILY  . fexofenadine (ALLEGRA) 180 MG tablet Take 180 mg by mouth daily.  Marland Kitchen glipiZIDE (GLUCOTROL) 10 MG tablet Take 10 mg by mouth 2 (two) times daily before  a meal.  . levothyroxine (SYNTHROID, LEVOTHROID) 88 MCG tablet TAKE 1 TABLET BY MOUTH DAILY  . lisinopril-hydrochlorothiazide (PRINZIDE,ZESTORETIC) 20-25 MG tablet TAKE 1 TABLET BY MOUTH DAILY  . metFORMIN (GLUCOPHAGE) 1000 MG tablet TAKE 1 TABLET(1000 MG) BY MOUTH TWICE DAILY WITH A MEAL  . thiamine 100 MG tablet Take 100 mg by mouth daily.  . valACYclovir (VALTREX) 1000 MG tablet TK 2 TS PO BID   No facility-administered encounter medications on file as of 04/03/2018.     Activities of Daily Living In your present state of health, do you have any difficulty performing the following activities: 04/03/2018  Hearing? N  Vision? N  Difficulty concentrating or making decisions? N  Comment ad8 score is 0   Walking or climbing stairs? Y  Comment cautious of falling   Dressing or bathing? N  Doing errands, shopping? N  Preparing Food and eating ? N  Using the Toilet? N  In the past six months, have you accidently leaked urine? N  Do you have problems with loss of bowel control? N  Managing your Medications? N  Managing your Finances? N  Housekeeping or managing your Housekeeping? N  Some recent data might be hidden    Patient Care Team: Dorothyann Peng, NP as PCP - General (Family Medicine)    Assessment:   This is a routine wellness examination for Caroline Suarez.  Exercise Activities and Dietary recommendations Current Exercise Habits: Home exercise routine, Type of exercise: strength training/weights;walking, Time (Minutes): 60, Frequency (Times/Week): 3, Weekly Exercise (Minutes/Week): 180, Intensity: Moderate  Goals    . Exercise 150 min/wk Moderate Activity     Join water aerobics class !        Fall Risk   Depression Screen PHQ 2/9 Scores 04/03/2018 04/03/2018  PHQ - 2 Score 0 0     Cognitive Function MMSE - Mini Mental State Exam 04/03/2018  Not completed: (No Data)     Ad8 score reviewed for issues:  Issues making decisions:  Less interest in hobbies /  activities:  Repeats questions, stories (family complaining):  Trouble using ordinary gadgets (microwave, computer, phone):  Forgets the month or year:   Mismanaging finances:   Remembering appts:  Daily problems with thinking and/or memory: Ad8 score is=0        Immunization History  Administered Date(s) Administered  .  Hepatitis B 03/23/1984  . Influenza-Unspecified 06/25/2016  . Pneumococcal Conjugate-13 10/16/2013  . Pneumococcal Polysaccharide-23 06/28/2009, 04/03/2018  . Td 10/12/2012  . Zoster Recombinat (Shingrix) 02/09/2017, 04/12/2017      Screening Tests Health Maintenance  Topic Date Due  . Hepatitis C Screening  [birth year]  . HEMOGLOBIN A1C  01/30/2018  . INFLUENZA VACCINE  04/26/2018  . OPHTHALMOLOGY EXAM  09/26/2018  . MAMMOGRAM  01/25/2019  . FOOT EXAM  04/04/2019  . TETANUS/TDAP  10/12/2022  . COLONOSCOPY  10/23/2024  . DEXA SCAN  Completed  . PNA vac Low Risk Adult  Completed        Plan:      PCP Notes   Health Maintenance Diabetic eye exam 10/2016 - had Jan 2019 Mammogram 01/2017 - has had one yet - will do Friday at gyn Dexa 03/2015 -1/9 and -2.1 Is on Fosamax July 2016   Dr. Julien Girt - will most likely follow. fx risk is 16%   Foot exam completed by Rory Percy her foot are normal; no issues    She has had the shingrix    Abnormal Screens  none  Referrals  none  Patient concerns; None   Nurse Concerns; As noted  Next PCP apt Was seen today       I have personally reviewed and noted the following in the patient's chart:   . Medical and social history . Use of alcohol, tobacco or illicit drugs  . Current medications and supplements . Functional ability and status . Nutritional status . Physical activity . Advanced directives . List of other physicians . Hospitalizations, surgeries, and ER visits in previous 12 months . Vitals . Screenings to include cognitive, depression, and falls . Referrals and  appointments  In addition, I have reviewed and discussed with patient certain preventive protocols, quality metrics, and best practice recommendations. A written personalized care plan for preventive services as well as general preventive health recommendations were provided to patient.     Wynetta Fines, RN  04/03/2018

## 2018-04-03 NOTE — Progress Notes (Signed)
Subjective:    Patient ID: Caroline Suarez, female    DOB: 20-Jan-1946, 72 y.o.   MRN: [id number]  HPI  Patient presents for yearly follow up exam. She is a pleasant 72 year old female who  has a past medical history of Arthritis, Asthma, Chicken pox, Heart murmur, Hyperlipidemia, Hypertension, Hypothyroidism, Osteoporosis, and Positive TB test.  DM - takes glipizide 10 mg BID and Metformin 1000 mg BID. She does not check her blood sugars at home  Lab Results  Component Value Date   HGBA1C 6.9 08/02/2017   Hypertension - Takes Norvasc 10 mg and Prinzide 20-25 mg. BP very well controlled.  BP Readings from Last 3 Encounters:  04/03/18 120/76  08/02/17 138/72  05/03/17 128/66   Hyperlipidemia - Takes Lipitor 10 mg  Lab Results  Component Value Date   CHOL 179 03/27/2017   HDL 60 03/27/2017   LDLCALC 76 03/27/2017   TRIG 214 (A) 03/27/2017   Hypothyroidism - Takes 88 mcg of synthroid Lab Results  Component Value Date   TSH 0.68 03/27/2017   All immunizations and health maintenance protocols were reviewed with the patient and needed orders were placed. She is due for Pneumovax 23  Appropriate screening laboratory values were ordered for the patient including screening of hyperlipidemia, renal function and hepatic function.  Medication reconciliation,  past medical history, social history, problem list and allergies were reviewed in detail with the patient  Goals were established with regard to weight loss, exercise, and  diet in compliance with medications. She has started exercising and doing water aerobics   End of life planning was discussed.   She participates in routine dental and vision screens. She is being seen by her GYN this week and is having her mammogram done. She is UTD on colonoscopy.   She recently lost her husband. She reports that she is doing as well as she can. She has periods of depression and crying spells. She has a good family support system.   Review  of Systems  Constitutional: Negative.   HENT: Negative.   Eyes: Negative.   Respiratory: Negative.   Cardiovascular: Negative.   Gastrointestinal: Negative.   Endocrine: Negative.   Genitourinary: Negative.   Musculoskeletal: Positive for back pain.  Skin: Negative.   Allergic/Immunologic: Negative.   Neurological: Negative.   Hematological: Negative.   Psychiatric/Behavioral: Negative.    Past Medical History:  Diagnosis Date  . Arthritis   . Asthma   . Chicken pox   . Heart murmur   . Hyperlipidemia   . Hypertension   . Hypothyroidism   . Osteoporosis   . Positive TB test     Social History   Socioeconomic History  . Marital status: Married    Spouse name: Not on file  . Number of children: Not on file  . Years of education: Not on file  . Highest education level: Not on file  Occupational History  . Not on file  Social Needs  . Financial resource strain: Not on file  . Food insecurity:    Worry: Not on file    Inability: Not on file  . Transportation needs:    Medical: Not on file    Non-medical: Not on file  Tobacco Use  . Smoking status: Never Smoker  . Smokeless tobacco: Never Used  Substance and Sexual Activity  . Alcohol use: No  . Drug use: No  . Sexual activity: Not on file  Lifestyle  . Physical activity:  Days per week: Not on file    Minutes per session: Not on file  . Stress: Not on file  Relationships  . Social connections:    Talks on phone: Not on file    Gets together: Not on file    Attends religious service: Not on file    Active member of club or organization: Not on file    Attends meetings of clubs or organizations: Not on file    Relationship status: Not on file  . Intimate partner violence:    Fear of current or ex partner: Not on file    Emotionally abused: Not on file    Physically abused: Not on file    Forced sexual activity: Not on file  Other Topics Concern  . Not on file  Social History Narrative   Retired Therapist, sports      Past Surgical History:  Procedure Laterality Date  . BREAST BIOPSY     multiple. None came back positive   . FOOT SURGERY Left 2002    Family History  Problem Relation Age of Onset  . Heart disease Mother   . Stroke Mother   . Hyperlipidemia Mother   . Pulmonary fibrosis Father     Allergies  Allergen Reactions  . Codeine Nausea And Vomiting    Current Outpatient Medications on File Prior to Visit  Medication Sig Dispense Refill  . albuterol (PROAIR HFA) 108 (90 Base) MCG/ACT inhaler Inhale 1-2 puffs every 6 (six) hours as needed into the lungs. 6.7 g 3  . alendronate (FOSAMAX) 70 MG tablet Take 70 mg by mouth once a week. Take with a full glass of water on an empty stomach.    Marland Kitchen amLODipine (NORVASC) 10 MG tablet TAKE 1 TABLET BY MOUTH DAILY 90 tablet 1  . aspirin EC 81 MG tablet Take 81 mg by mouth daily.    Marland Kitchen atorvastatin (LIPITOR) 10 MG tablet TAKE 1 TABLET BY MOUTH DAILY 90 tablet 0  . fexofenadine (ALLEGRA) 180 MG tablet Take 180 mg by mouth daily.    Marland Kitchen glipiZIDE (GLUCOTROL) 10 MG tablet Take 10 mg by mouth 2 (two) times daily before a meal.    . levothyroxine (SYNTHROID, LEVOTHROID) 88 MCG tablet TAKE 1 TABLET BY MOUTH DAILY 90 tablet 1  . lisinopril-hydrochlorothiazide (PRINZIDE,ZESTORETIC) 20-25 MG tablet TAKE 1 TABLET BY MOUTH DAILY 90 tablet 0  . metFORMIN (GLUCOPHAGE) 1000 MG tablet TAKE 1 TABLET(1000 MG) BY MOUTH TWICE DAILY WITH A MEAL 180 tablet 0  . thiamine 100 MG tablet Take 100 mg by mouth daily.    . valACYclovir (VALTREX) 1000 MG tablet TK 2 TS PO BID  3   No current facility-administered medications on file prior to visit.     BP 120/76 (BP Location: Left Wrist)   Temp 98.2 F (36.8 C) (Oral)   Ht 5\' 2"  (1.575 m)   Wt 243 lb (110.2 kg)   BMI 44.45 kg/m       Objective:   Physical Exam  Constitutional: She is oriented to person, place, and time. She appears well-developed and well-nourished. No distress.  HENT:  Head: Normocephalic and  atraumatic.  Right Ear: External ear normal.  Left Ear: External ear normal.  Nose: Nose normal.  Mouth/Throat: Oropharynx is clear and moist. No oropharyngeal exudate.  Eyes: Pupils are equal, round, and reactive to light. Conjunctivae and EOM are normal. Right eye exhibits no discharge. Left eye exhibits no discharge. No scleral icterus.  Neck: Normal range of motion.  No JVD present. No tracheal deviation present. No thyromegaly present.  Cardiovascular: Normal rate, regular rhythm, normal heart sounds and intact distal pulses. Exam reveals no gallop and no friction rub.  No murmur heard. Pulmonary/Chest: Effort normal and breath sounds normal. No stridor. No respiratory distress. She has no wheezes. She has no rales. She exhibits no tenderness.  Abdominal: Soft. Bowel sounds are normal. She exhibits no distension and no mass. There is no tenderness. There is no rebound and no guarding. No hernia.  Genitourinary:  Genitourinary Comments: Will be done by GYN   Musculoskeletal: Normal range of motion. She exhibits no edema, tenderness or deformity.  Lymphadenopathy:    She has no cervical adenopathy.  Neurological: She is alert and oriented to person, place, and time. She displays normal reflexes. No cranial nerve deficit or sensory deficit. She exhibits normal muscle tone. Coordination normal.  Skin: Skin is warm and dry. Capillary refill takes less than 2 seconds. No rash noted. She is not diaphoretic. No erythema. No pallor.  Psychiatric: She has a normal mood and affect. Her behavior is normal. Judgment and thought content normal.  Tearful during exam   Nursing note and vitals reviewed.     Assessment & Plan:  1. Type 2 diabetes mellitus without complication, without long-term current use of insulin ([place]) - Consider adding agent  - I would like her to start monitoring her blood sugar at home  - Encouraged heart healthy diet and exercise  - Follow up in three months  - Basic metabolic  panel - CBC with Differential/Platelet - Hepatic function panel - Lipid panel - TSH - Hemoglobin A1c  2. Mixed hyperlipidemia - Consider increase in statin  - Basic metabolic panel - CBC with Differential/Platelet - Hepatic function panel - Lipid panel - TSH - Hemoglobin A1c  3. Essential hypertension - Well controlled. No change in medications  - Basic metabolic panel - CBC with Differential/Platelet - Hepatic function panel - Lipid panel - TSH - Hemoglobin A1c  4. Hypothyroidism, unspecified type - Consider change in therapy dose  - TSH  5. Encounter for screening for HIV  - HIV antibody  6. Need for hepatitis C screening test  - Hep C Antibody  7. Need for Streptococcus pneumoniae vaccination  - Pneumococcal polysaccharide vaccine 23-valent greater than or equal to 72yo subcutaneous/IM  8. Grieving - Tearful during exam. She does not want any medication. I agree with this plan. She will follow up as needed  Dorothyann Peng, NP

## 2018-04-03 NOTE — Progress Notes (Signed)
I have reviewed documentation for AWV and Advance Care Planning provided by the health coach and agree with documentation. I was immediately available for questions.  

## 2018-04-04 ENCOUNTER — Encounter: Payer: Self-pay | Admitting: Family Medicine

## 2018-04-04 LAB — HIV ANTIBODY (ROUTINE TESTING W REFLEX): HIV 1&2 Ab, 4th Generation: NONREACTIVE

## 2018-04-04 LAB — HEPATITIS C ANTIBODY
Hepatitis C Ab: NONREACTIVE
SIGNAL TO CUT-OFF: 0.02 (ref <1.00)

## 2018-04-06 DIAGNOSIS — Z1231 Encounter for screening mammogram for malignant neoplasm of breast: Secondary | ICD-10-CM | POA: Diagnosis not present

## 2018-04-06 DIAGNOSIS — Z124 Encounter for screening for malignant neoplasm of cervix: Secondary | ICD-10-CM | POA: Diagnosis not present

## 2018-04-06 DIAGNOSIS — Z6841 Body Mass Index (BMI) 40.0 and over, adult: Secondary | ICD-10-CM | POA: Diagnosis not present

## 2018-05-07 ENCOUNTER — Telehealth: Payer: Self-pay | Admitting: Adult Health

## 2018-05-07 ENCOUNTER — Other Ambulatory Visit: Payer: Self-pay

## 2018-05-07 MED ORDER — ATORVASTATIN CALCIUM 10 MG PO TABS: 20.0000 mg | ORAL_TABLET | Freq: Every day | ORAL | 0 refills | Status: DC

## 2018-05-07 NOTE — Telephone Encounter (Signed)
Copied from [place] [id number]. Topic: Quick Communication - Rx Refill/Question >> May 07, 2018  8:02 AM Scherrie Gerlach wrote: Medication: atorvastatin (LIPITOR) 10 MG tablet 90 day Pt states at last visit she was instructed to increase this med to 2 tabs, and will run out early of current Rx. Would like new script sent to [facility], [address] AT [place] [phone] (Phone) [phone] (Fax)

## 2018-05-23 ENCOUNTER — Other Ambulatory Visit: Payer: Self-pay | Admitting: Adult Health

## 2018-05-23 DIAGNOSIS — E119 Type 2 diabetes mellitus without complications: Secondary | ICD-10-CM

## 2018-05-24 NOTE — Telephone Encounter (Signed)
Sent to the pharmacy by e-scribe.  Pt is scheduled to see [facility] for A1C follow up on 07/12/18.

## 2018-06-13 ENCOUNTER — Other Ambulatory Visit: Payer: Self-pay | Admitting: Adult Health

## 2018-06-13 NOTE — Telephone Encounter (Signed)
Sent to the pharmacy by e-scribe. 

## 2018-06-22 ENCOUNTER — Other Ambulatory Visit: Payer: Self-pay | Admitting: *Deleted

## 2018-06-22 MED ORDER — GLIPIZIDE 10 MG PO TABS: 10.0000 mg | ORAL_TABLET | Freq: Two times a day (BID) | ORAL | 1 refills | Status: DC

## 2018-07-12 ENCOUNTER — Ambulatory Visit (INDEPENDENT_AMBULATORY_CARE_PROVIDER_SITE_OTHER): Payer: Medicare Other | Admitting: Adult Health

## 2018-07-12 ENCOUNTER — Encounter: Payer: Self-pay | Admitting: Adult Health

## 2018-07-12 VITALS — BP 124/70 | Temp 98.1°F | Wt 227.0 lb

## 2018-07-12 DIAGNOSIS — E119 Type 2 diabetes mellitus without complications: Secondary | ICD-10-CM

## 2018-07-12 LAB — POCT GLYCOSYLATED HEMOGLOBIN (HGB A1C): HBA1C, POC (CONTROLLED DIABETIC RANGE): 5.7 % (ref 0.0–7.0)

## 2018-07-12 NOTE — Progress Notes (Signed)
Subjective:    Patient ID: Caroline Suarez, female    DOB: 09/18/1946, 72 y.o.   MRN: [id number]  HPI 72 year old female who  has a past medical history of Arthritis, Asthma, Chicken pox, Heart murmur, Hyperlipidemia, Hypertension, Hypothyroidism, Osteoporosis, and Positive TB test.  She presents to the office today for follow up regarding DM.  She is currently prescribed Glipizide 10 mg daily and Metformin 1000 mg BID. She reports that over the last three months she has been working on diet, has joined [employer] and has been going to the gym three days a week. She has been able to lose 16 pounds.   Her last A1c was 7.1 in July   She has been checking her blood sugars in the morning and has fasting results around 110.   Wt Readings from Last 3 Encounters:  07/12/18 227 lb (103 kg)  04/03/18 243 lb (110.2 kg)  04/03/18 243 lb (110.2 kg)    Review of Systems See HPI   Past Medical History:  Diagnosis Date  . Arthritis   . Asthma   . Chicken pox   . Heart murmur   . Hyperlipidemia   . Hypertension   . Hypothyroidism   . Osteoporosis   . Positive TB test     Social History   Socioeconomic History  . Marital status: Married    Spouse name: Not on file  . Number of children: Not on file  . Years of education: Not on file  . Highest education level: Not on file  Occupational History  . Not on file  Social Needs  . Financial resource strain: Not on file  . Food insecurity:    Worry: Not on file    Inability: Not on file  . Transportation needs:    Medical: Not on file    Non-medical: Not on file  Tobacco Use  . Smoking status: Never Smoker  . Smokeless tobacco: Never Used  Substance and Sexual Activity  . Alcohol use: No  . Drug use: No  . Sexual activity: Not on file  Lifestyle  . Physical activity:    Days per week: Not on file    Minutes per session: Not on file  . Stress: Not on file  Relationships  . Social connections:    Talks on phone: Not on file     Gets together: Not on file    Attends religious service: Not on file    Active member of club or organization: Not on file    Attends meetings of clubs or organizations: Not on file    Relationship status: Not on file  . Intimate partner violence:    Fear of current or ex partner: Not on file    Emotionally abused: Not on file    Physically abused: Not on file    Forced sexual activity: Not on file  Other Topics Concern  . Not on file  Social History Narrative   Retired Therapist, sports     Past Surgical History:  Procedure Laterality Date  . BREAST BIOPSY     multiple. None came back positive   . FOOT SURGERY Left 2002    Family History  Problem Relation Age of Onset  . Heart disease Mother   . Stroke Mother   . Hyperlipidemia Mother   . Pulmonary fibrosis Father     Allergies  Allergen Reactions  . Codeine Nausea And Vomiting    Current Outpatient Medications on File Prior  to Visit  Medication Sig Dispense Refill  . albuterol (PROAIR HFA) 108 (90 Base) MCG/ACT inhaler Inhale 1-2 puffs every 6 (six) hours as needed into the lungs. 6.7 g 3  . alendronate (FOSAMAX) 70 MG tablet Take 70 mg by mouth once a week. Take with a full glass of water on an empty stomach.    Marland Kitchen amLODipine (NORVASC) 10 MG tablet TAKE 1 TABLET BY MOUTH DAILY 90 tablet 1  . aspirin EC 81 MG tablet Take 81 mg by mouth daily.    Marland Kitchen atorvastatin (LIPITOR) 10 MG tablet Take 2 tablets (20 mg total) by mouth daily. 180 tablet 0  . fexofenadine (ALLEGRA) 180 MG tablet Take 180 mg by mouth daily.    Marland Kitchen glipiZIDE (GLUCOTROL) 10 MG tablet Take 1 tablet (10 mg total) by mouth 2 (two) times daily before a meal. 90 tablet 1  . levothyroxine (SYNTHROID, LEVOTHROID) 88 MCG tablet TAKE 1 TABLET BY MOUTH DAILY 90 tablet 1  . lisinopril-hydrochlorothiazide (PRINZIDE,ZESTORETIC) 20-25 MG tablet TAKE 1 TABLET BY MOUTH DAILY 90 tablet 2  . metFORMIN (GLUCOPHAGE) 1000 MG tablet TAKE 1 TABLET(1000 MG) BY MOUTH TWICE DAILY WITH A MEAL  180 tablet 0  . thiamine 100 MG tablet Take 100 mg by mouth daily.    . valACYclovir (VALTREX) 1000 MG tablet TK 2 TS PO BID  3   No current facility-administered medications on file prior to visit.     BP 124/70   Temp 98.1 F (36.7 C) (Oral)   Wt 227 lb (103 kg)   BMI 41.52 kg/m       Objective:   Physical Exam  Constitutional: She is oriented to person, place, and time. She appears well-developed and well-nourished. No distress.  Neurological: She is alert and oriented to person, place, and time.  Skin: Skin is warm and dry. She is not diaphoretic.  Psychiatric: She has a normal mood and affect. Her behavior is normal. Judgment and thought content normal.  Nursing note and vitals reviewed.     Assessment & Plan:  1. Type 2 diabetes mellitus without complication, without long-term current use of insulin ([facility]) - POCT A1C - 5.7 - has improved - She was congratulated on her weight loss and lifestyle modification  - I am going to have her D/C Glipizide. Was advised that if BS goes above 140 consistently then take 5 mg of Glipzide BID  - Follow up in three months   Dorothyann Peng, NP

## 2018-09-04 ENCOUNTER — Other Ambulatory Visit: Payer: Self-pay | Admitting: Adult Health

## 2018-09-04 NOTE — Telephone Encounter (Signed)
Sent to the pharmacy by e-scribe. 

## 2018-09-06 ENCOUNTER — Other Ambulatory Visit: Payer: Self-pay | Admitting: Adult Health

## 2018-09-06 DIAGNOSIS — E119 Type 2 diabetes mellitus without complications: Secondary | ICD-10-CM

## 2018-09-07 NOTE — Telephone Encounter (Signed)
Sent to the pharmacy by e-scribe. 

## 2018-10-16 ENCOUNTER — Ambulatory Visit: Payer: Medicare Other | Admitting: Adult Health

## 2018-10-17 DIAGNOSIS — E119 Type 2 diabetes mellitus without complications: Secondary | ICD-10-CM | POA: Diagnosis not present

## 2018-10-17 DIAGNOSIS — H52223 Regular astigmatism, bilateral: Secondary | ICD-10-CM | POA: Diagnosis not present

## 2018-10-17 DIAGNOSIS — H5202 Hypermetropia, left eye: Secondary | ICD-10-CM | POA: Diagnosis not present

## 2018-10-17 DIAGNOSIS — H25813 Combined forms of age-related cataract, bilateral: Secondary | ICD-10-CM | POA: Diagnosis not present

## 2018-10-22 ENCOUNTER — Telehealth: Payer: Self-pay | Admitting: *Deleted

## 2018-10-22 NOTE — Telephone Encounter (Signed)
She does not need to be fasting.   Thanks

## 2018-10-22 NOTE — Telephone Encounter (Signed)
Copied from [place] [id number]. Topic: Appointment Scheduling - Scheduling Inquiry for Clinic >> Oct 22, 2018  9:23 AM Nathanial Millman J wrote: Reason for CRM: pt would like to know if she will have fasting labs for her appt tomorrow Cb is [phone]   Per previous OV notes it looks like she is only needing a 3 mo ROV and A1c recheck, which she does not need to fast for. Was there anything else that needed to be checked? Appt is scheduled at 445pm on 10/23/2018

## 2018-10-23 ENCOUNTER — Encounter: Payer: Self-pay | Admitting: Adult Health

## 2018-10-23 ENCOUNTER — Ambulatory Visit (INDEPENDENT_AMBULATORY_CARE_PROVIDER_SITE_OTHER): Payer: Medicare Other | Admitting: Adult Health

## 2018-10-23 VITALS — BP 110/64 | Temp 98.2°F | Wt 196.0 lb

## 2018-10-23 DIAGNOSIS — R059 Cough, unspecified: Secondary | ICD-10-CM

## 2018-10-23 DIAGNOSIS — R05 Cough: Secondary | ICD-10-CM

## 2018-10-23 DIAGNOSIS — E119 Type 2 diabetes mellitus without complications: Secondary | ICD-10-CM | POA: Diagnosis not present

## 2018-10-23 LAB — POCT GLYCOSYLATED HEMOGLOBIN (HGB A1C): HBA1C, POC (CONTROLLED DIABETIC RANGE): 5.8 % (ref 0.0–7.0)

## 2018-10-23 MED ORDER — PREDNISONE 20 MG PO TABS: 20.0000 mg | ORAL_TABLET | Freq: Every day | ORAL | 0 refills | Status: AC

## 2018-10-23 MED ORDER — BENZONATATE 200 MG PO CAPS: 200.0000 mg | ORAL_CAPSULE | Freq: Two times a day (BID) | ORAL | 0 refills | Status: DC | PRN

## 2018-10-23 MED ORDER — DOXYCYCLINE HYCLATE 100 MG PO CAPS: 100.0000 mg | ORAL_CAPSULE | Freq: Two times a day (BID) | ORAL | 0 refills | Status: DC

## 2018-10-23 NOTE — Progress Notes (Signed)
Subjective:    Patient ID: Caroline Suarez, female    DOB: 07/24/1946, 73 y.o.   MRN: [id number]  HPI 73 year old female who  has a past medical history of Arthritis, Asthma, Chicken pox, Heart murmur, Hyperlipidemia, Hypertension, Hypothyroidism, Osteoporosis, and Positive TB test.  She presents to the office today for follow up regarding DM. Her last A1c was 5.7, in October. At this time she was working on diet and exercise, had joined [employer] and was able to lose 16 pounds. At this time she was advised to stop Glipizide and continue Metformin. If her blood sugars increased above 140 then can restart glipizide. She reports that her blood sugars stayed WNL for a short time and the started to creep up. She ultimately started back on glipizide 2.5 mg BID. She continues to have have blood sugar readings between 100-150.   She continues to work with [employer] and has been exercising.  She has been able to lose significant weight.   Wt Readings from Last 3 Encounters:  10/23/18 196 lb (88.9 kg)  07/12/18 227 lb (103 kg)  04/03/18 243 lb (110.2 kg)   She also has an acute issue of productive cough x 8 weeks. She denies any other symptoms such as sinus pain, pressure, fevers, or feeling acutely ill. She has not been using any other the counter medications. She does endorse feeling short of breath and having wheezing from time to time, but happens more after exercise.    Review of Systems See HPI   Past Medical History:  Diagnosis Date  . Arthritis   . Asthma   . Chicken pox   . Heart murmur   . Hyperlipidemia   . Hypertension   . Hypothyroidism   . Osteoporosis   . Positive TB test     Social History   Socioeconomic History  . Marital status: Widowed    Spouse name: Not on file  . Number of children: Not on file  . Years of education: Not on file  . Highest education level: Not on file  Occupational History  . Not on file  Social Needs  . Financial resource  strain: Not on file  . Food insecurity:    Worry: Not on file    Inability: Not on file  . Transportation needs:    Medical: Not on file    Non-medical: Not on file  Tobacco Use  . Smoking status: Never Smoker  . Smokeless tobacco: Never Used  Substance and Sexual Activity  . Alcohol use: No  . Drug use: No  . Sexual activity: Not on file  Lifestyle  . Physical activity:    Days per week: Not on file    Minutes per session: Not on file  . Stress: Not on file  Relationships  . Social connections:    Talks on phone: Not on file    Gets together: Not on file    Attends religious service: Not on file    Active member of club or organization: Not on file    Attends meetings of clubs or organizations: Not on file    Relationship status: Not on file  . Intimate partner violence:    Fear of current or ex partner: Not on file    Emotionally abused: Not on file    Physically abused: Not on file    Forced sexual activity: Not on file  Other Topics Concern  . Not on file  Social History Narrative  Retired Therapist, sports     Past Surgical History:  Procedure Laterality Date  . BREAST BIOPSY     multiple. None came back positive   . FOOT SURGERY Left 2002    Family History  Problem Relation Age of Onset  . Heart disease Mother   . Stroke Mother   . Hyperlipidemia Mother   . Pulmonary fibrosis Father     Allergies  Allergen Reactions  . Codeine Nausea And Vomiting    Current Outpatient Medications on File Prior to Visit  Medication Sig Dispense Refill  . albuterol (PROAIR HFA) 108 (90 Base) MCG/ACT inhaler Inhale 1-2 puffs every 6 (six) hours as needed into the lungs. 6.7 g 3  . alendronate (FOSAMAX) 70 MG tablet Take 70 mg by mouth once a week. Take with a full glass of water on an empty stomach.    Marland Kitchen amLODipine (NORVASC) 10 MG tablet TAKE 1 TABLET BY MOUTH DAILY 90 tablet 1  . aspirin EC 81 MG tablet Take 81 mg by mouth daily.    Marland Kitchen atorvastatin (LIPITOR) 10 MG tablet TAKE 2  TABLETS BY MOUTH DAILY 180 tablet 1  . fexofenadine (ALLEGRA) 180 MG tablet Take 180 mg by mouth daily.    Marland Kitchen glipiZIDE (GLUCOTROL) 5 MG tablet Take 2.5 mg by mouth daily before breakfast.    . levothyroxine (SYNTHROID, LEVOTHROID) 88 MCG tablet TAKE 1 TABLET BY MOUTH DAILY 90 tablet 1  . lisinopril-hydrochlorothiazide (PRINZIDE,ZESTORETIC) 20-25 MG tablet TAKE 1 TABLET BY MOUTH DAILY 90 tablet 2  . metFORMIN (GLUCOPHAGE) 1000 MG tablet TAKE 1 TABLET(1000 MG) BY MOUTH TWICE DAILY WITH A MEAL 180 tablet 0  . thiamine 100 MG tablet Take 100 mg by mouth daily.    . valACYclovir (VALTREX) 1000 MG tablet TK 2 TS PO BID  3   No current facility-administered medications on file prior to visit.     BP 110/64   Temp 98.2 F (36.8 C)   Wt 196 lb (88.9 kg)   BMI 35.85 kg/m       Objective:   Physical Exam Vitals signs and nursing note reviewed.  Constitutional:      Appearance: Normal appearance.  HENT:     Nose: Nose normal.     Mouth/Throat:     Mouth: Mucous membranes are moist.     Pharynx: Oropharynx is clear.  Cardiovascular:     Rate and Rhythm: Normal rate and regular rhythm.     Pulses: Normal pulses.     Heart sounds: Normal heart sounds.  Pulmonary:     Effort: Pulmonary effort is normal. No respiratory distress.     Breath sounds: No wheezing, rhonchi or rales.     Comments: Productive cough present  Chest:     Chest wall: No tenderness.  Abdominal:     General: Abdomen is flat.     Palpations: Abdomen is soft.  Musculoskeletal: Normal range of motion.        General: No swelling or tenderness.  Skin:    General: Skin is warm and dry.     Capillary Refill: Capillary refill takes less than 2 seconds.  Neurological:     General: No focal deficit present.     Mental Status: She is alert and oriented to person, place, and time.  Psychiatric:        Mood and Affect: Mood normal.        Behavior: Behavior normal.        Thought Content: Thought content normal.  Judgment: Judgment normal.       Assessment & Plan:  1. Type 2 diabetes mellitus without complication, without long-term current use of insulin (HCC)  - glipiZIDE (GLUCOTROL) 5 MG tablet; Take 2.5 mg by mouth daily before breakfast. - POCT A1C- 5. 8 - Congratulated on weight loss through diet and exercise - Continue with current plan  - Follow up in 6 months or sooner if needed  2. Cough  - DG Chest 2 View; Future - predniSONE (DELTASONE) 20 MG tablet; Take 1 tablet (20 mg total) by mouth daily with breakfast for 7 days.  Dispense: 7 tablet; Refill: 0 - doxycycline (VIBRAMYCIN) 100 MG capsule; Take 1 capsule (100 mg total) by mouth 2 (two) times daily.  Dispense: 14 capsule; Refill: 0 - benzonatate (TESSALON) 200 MG capsule; Take 1 capsule (200 mg total) by mouth 2 (two) times daily as needed for cough.  Dispense: 20 capsule; Refill: 0  Dorothyann Peng, NP

## 2018-10-23 NOTE — Telephone Encounter (Signed)
Left detailed message making patient aware no need to fast. Nothing further needed.

## 2018-10-24 ENCOUNTER — Ambulatory Visit (INDEPENDENT_AMBULATORY_CARE_PROVIDER_SITE_OTHER): Payer: Medicare Other

## 2018-10-24 DIAGNOSIS — R05 Cough: Secondary | ICD-10-CM

## 2018-10-24 DIAGNOSIS — R059 Cough, unspecified: Secondary | ICD-10-CM

## 2018-10-29 ENCOUNTER — Telehealth: Payer: Self-pay | Admitting: Adult Health

## 2018-10-29 NOTE — Telephone Encounter (Signed)
Pt given lab results and documented in result note.  

## 2018-10-29 NOTE — Telephone Encounter (Signed)
Copied from [place] [id number]. Topic: Quick Communication - Other Results (Clinic Use ONLY) >> Oct 26, 2018 11:02 AM Miles Costain T, CMA wrote: Called patient to inform them of chest x-ray result from 10/24/2018. When patient returns call, triage nurse may disclose results. >> Oct 29, 2018 11:25 AM Bea Graff, NT wrote: Pt calling back to receive xray results.

## 2018-11-08 ENCOUNTER — Encounter: Payer: Self-pay | Admitting: Adult Health

## 2018-11-09 NOTE — Telephone Encounter (Signed)
Dr Fry please advise. thanks 

## 2018-11-13 NOTE — Telephone Encounter (Signed)
I will forward this to [facility]

## 2018-11-14 ENCOUNTER — Other Ambulatory Visit: Payer: Self-pay | Admitting: Adult Health

## 2018-11-14 DIAGNOSIS — E119 Type 2 diabetes mellitus without complications: Secondary | ICD-10-CM

## 2018-11-14 MED ORDER — PREDNISONE 10 MG PO TABS: ORAL_TABLET | ORAL | 0 refills | Status: DC

## 2018-11-14 MED ORDER — GLIPIZIDE 5 MG PO TABS: 2.5000 mg | ORAL_TABLET | Freq: Two times a day (BID) | ORAL | 0 refills | Status: DC

## 2018-11-19 ENCOUNTER — Telehealth: Payer: Self-pay | Admitting: Adult Health

## 2018-11-19 NOTE — Telephone Encounter (Signed)
Copied from [place] [id number]. Topic: Quick Communication - Rx Refill/Question >> Nov 19, 2018  1:18 PM Reyne Dumas L wrote: Medication: valACYclovir (VALTREX) 1000 MG tablet  Has the patient contacted their pharmacy? Yes - states pharmacy web site states this can't be refilled without new script (Agent: If no, request that the patient contact the pharmacy for the refill.) (Agent: If yes, when and what did the pharmacy advise?)  Preferred Pharmacy (with phone number or street name): [facility] [place], [place] - [address] [phone] (Phone) [phone] (Fax)  Agent: Please be advised that RX refills may take up to 3 business days. We ask that you follow-up with your pharmacy.

## 2018-11-19 NOTE — Telephone Encounter (Signed)
Refill request; also see CRM # [phone]; will route to office for final disposition.  Requested medication (s) are due for refill today: valacyclovir  Requested medication (s) are on the active medication list: yes  Last refill:  06/26/17  Future visit scheduled: yes  Notes to clinic: historical provider; pharmacy needs new prescription

## 2018-11-20 MED ORDER — VALACYCLOVIR HCL 1 G PO TABS: ORAL_TABLET | ORAL | 3 refills | Status: DC

## 2018-11-20 NOTE — Addendum Note (Signed)
Addended by: Apolinar Junes on: 11/20/2018 10:22 AM   Modules accepted: Orders

## 2018-11-28 ENCOUNTER — Other Ambulatory Visit: Payer: Self-pay | Admitting: Adult Health

## 2018-11-28 DIAGNOSIS — E119 Type 2 diabetes mellitus without complications: Secondary | ICD-10-CM

## 2018-11-30 NOTE — Telephone Encounter (Signed)
Sent to the pharmacy by e-scribe.  Pt has upcoming appt in July 2020. 

## 2019-01-28 ENCOUNTER — Telehealth: Payer: Self-pay | Admitting: Adult Health

## 2019-01-28 NOTE — Telephone Encounter (Signed)
Copied from [place] [id number]. Topic: Quick Communication - Rx Refill/Question >> Jan 28, 2019  9:34 AM Antonieta Iba C wrote: Medication: alendronate (FOSAMAX) 70 MG tablet--  90 day supply   Has the patient contacted their pharmacy? Yes   (Agent: If no, request that the patient contact the pharmacy for the refill.) (Agent: If yes, when and what did the pharmacy advise?)  Preferred Pharmacy (with phone number or street name): [facility] [place], [place] - [address] [place] [phone] (Phone) [phone] (Fax)    Agent: Please be advised that RX refills may take up to 3 business days. We ask that you follow-up with your pharmacy.

## 2019-01-28 NOTE — Telephone Encounter (Signed)
Cory, I don't see that you have filled this medication in the past.  Please advise.

## 2019-01-29 MED ORDER — ALENDRONATE SODIUM 70 MG PO TABS: 70.0000 mg | ORAL_TABLET | ORAL | 3 refills | Status: DC

## 2019-01-29 NOTE — Telephone Encounter (Signed)
Ok to refill for 1 year 

## 2019-01-29 NOTE — Telephone Encounter (Signed)
Sent to the pharmacy by e-scribe. 

## 2019-02-07 ENCOUNTER — Other Ambulatory Visit: Payer: Self-pay | Admitting: Adult Health

## 2019-02-07 DIAGNOSIS — E119 Type 2 diabetes mellitus without complications: Secondary | ICD-10-CM

## 2019-02-11 NOTE — Telephone Encounter (Signed)
Sent to the pharmacy by e-scribe. 

## 2019-02-12 ENCOUNTER — Ambulatory Visit (INDEPENDENT_AMBULATORY_CARE_PROVIDER_SITE_OTHER): Payer: Medicare Other | Admitting: Adult Health

## 2019-02-12 ENCOUNTER — Other Ambulatory Visit: Payer: Self-pay

## 2019-02-12 ENCOUNTER — Encounter: Payer: Self-pay | Admitting: Adult Health

## 2019-02-12 DIAGNOSIS — I952 Hypotension due to drugs: Secondary | ICD-10-CM

## 2019-02-12 NOTE — Progress Notes (Signed)
Virtual Visit via Video Note  I connected with Caroline Suarez on 02/12/19 at  2:00 PM EDT by a video enabled telemedicine application and verified that I am speaking with the correct person using two identifiers.  Location patient: home Location provider:work or home office Persons participating in the virtual visit: patient, provider  I discussed the limitations of evaluation and management by telemedicine and the availability of in person appointments. The patient expressed understanding and agreed to proceed.   HPI: 73 year old female who is being evaluated today for hypotension.  She reports that she has had many readings in the 90s over 34s recently and has started to feel as though she was going to pass out when she was walking.  She has been able to lose approximately 60 pounds over the last year.  She is currently prescribed Norvasc 10 mg and lisinopril/hydrochlorothiazide 20-25 mg   ROS: See pertinent positives and negatives per HPI.  Past Medical History:  Diagnosis Date  . Arthritis   . Asthma   . Chicken pox   . Heart murmur   . Hyperlipidemia   . Hypertension   . Hypothyroidism   . Osteoporosis   . Positive TB test     Past Surgical History:  Procedure Laterality Date  . BREAST BIOPSY     multiple. None came back positive   . FOOT SURGERY Left 2002    Family History  Problem Relation Age of Onset  . Heart disease Mother   . Stroke Mother   . Hyperlipidemia Mother   . Pulmonary fibrosis Father      Current Outpatient Medications:  .  albuterol (PROAIR HFA) 108 (90 Base) MCG/ACT inhaler, Inhale 1-2 puffs every 6 (six) hours as needed into the lungs., Disp: 6.7 g, Rfl: 3 .  alendronate (FOSAMAX) 70 MG tablet, Take 1 tablet (70 mg total) by mouth once a week. Take with a full glass of water on an empty stomach., Disp: 12 tablet, Rfl: 3 .  aspirin EC 81 MG tablet, Take 81 mg by mouth daily., Disp: , Rfl:  .  atorvastatin (LIPITOR) 10 MG tablet, TAKE 2 TABLETS  BY MOUTH DAILY, Disp: 180 tablet, Rfl: 1 .  benzonatate (TESSALON) 200 MG capsule, Take 1 capsule (200 mg total) by mouth 2 (two) times daily as needed for cough., Disp: 20 capsule, Rfl: 0 .  doxycycline (VIBRAMYCIN) 100 MG capsule, Take 1 capsule (100 mg total) by mouth 2 (two) times daily., Disp: 14 capsule, Rfl: 0 .  fexofenadine (ALLEGRA) 180 MG tablet, Take 180 mg by mouth daily., Disp: , Rfl:  .  glipiZIDE (GLUCOTROL) 5 MG tablet, TAKE 1/2 TABLET(2.5 MG) BY MOUTH TWICE DAILY BEFORE A MEAL, Disp: 90 tablet, Rfl: 0 .  levothyroxine (SYNTHROID, LEVOTHROID) 88 MCG tablet, TAKE 1 TABLET BY MOUTH DAILY, Disp: 90 tablet, Rfl: 1 .  lisinopril-hydrochlorothiazide (PRINZIDE,ZESTORETIC) 20-25 MG tablet, TAKE 1 TABLET BY MOUTH DAILY, Disp: 90 tablet, Rfl: 2 .  metFORMIN (GLUCOPHAGE) 1000 MG tablet, TAKE 1 TABLET(1000 MG) BY MOUTH TWICE DAILY WITH A MEAL, Disp: 180 tablet, Rfl: 1 .  predniSONE (DELTASONE) 10 MG tablet, 40 mg x 3 days, 20 mg x 3 days, 10 mg x 3 days, Disp: 21 tablet, Rfl: 0 .  thiamine 100 MG tablet, Take 100 mg by mouth daily., Disp: , Rfl:  .  valACYclovir (VALTREX) 1000 MG tablet, TK 2 TS PO BID, Disp: 90 tablet, Rfl: 3  EXAM:  VITALS per patient if applicable:  GENERAL: alert, oriented, appears  well and in no acute distress  HEENT: atraumatic, conjunttiva clear, no obvious abnormalities on inspection of external nose and ears  NECK: normal movements of the head and neck  LUNGS: on inspection no signs of respiratory distress, breathing rate appears normal, no obvious gross SOB, gasping or wheezing  CV: no obvious cyanosis  MS: moves all visible extremities without noticeable abnormality  PSYCH/NEURO: pleasant and cooperative, no obvious depression or anxiety, speech and thought processing grossly intact  ASSESSMENT AND PLAN:  Discussed the following assessment and plan:  Hypotension due to drugs -I will have her discontinue Norvasc.  She is going to monitor her blood  pressures at home and send me the results over the next week via my chart.    I discussed the assessment and treatment plan with the patient. The patient was provided an opportunity to ask questions and all were answered. The patient agreed with the plan and demonstrated an understanding of the instructions.   The patient was advised to call back or seek an in-person evaluation if the symptoms worsen or if the condition fails to improve as anticipated.   Dorothyann Peng, NP

## 2019-02-20 ENCOUNTER — Other Ambulatory Visit: Payer: Self-pay | Admitting: Adult Health

## 2019-02-20 ENCOUNTER — Encounter: Payer: Self-pay | Admitting: Adult Health

## 2019-02-20 MED ORDER — LISINOPRIL 20 MG PO TABS: 20.0000 mg | ORAL_TABLET | Freq: Every day | ORAL | 1 refills | Status: DC

## 2019-02-23 ENCOUNTER — Other Ambulatory Visit: Payer: Self-pay | Admitting: Adult Health

## 2019-02-26 NOTE — Telephone Encounter (Signed)
Lisinopril-hctz and amlodipine have been d/c.  Atorvastatin and levothyroxine sent to the pharmacy by e-scribe for 90 days.

## 2019-03-07 ENCOUNTER — Encounter: Payer: Self-pay | Admitting: Adult Health

## 2019-03-08 ENCOUNTER — Other Ambulatory Visit: Payer: Self-pay | Admitting: Adult Health

## 2019-03-08 MED ORDER — LISINOPRIL 10 MG PO TABS: 10.0000 mg | ORAL_TABLET | Freq: Every day | ORAL | 1 refills | Status: DC

## 2019-03-28 DIAGNOSIS — Z85828 Personal history of other malignant neoplasm of skin: Secondary | ICD-10-CM | POA: Diagnosis not present

## 2019-03-28 DIAGNOSIS — L820 Inflamed seborrheic keratosis: Secondary | ICD-10-CM | POA: Diagnosis not present

## 2019-03-28 DIAGNOSIS — D2271 Melanocytic nevi of right lower limb, including hip: Secondary | ICD-10-CM | POA: Diagnosis not present

## 2019-03-28 DIAGNOSIS — L814 Other melanin hyperpigmentation: Secondary | ICD-10-CM | POA: Diagnosis not present

## 2019-03-28 DIAGNOSIS — D2272 Melanocytic nevi of left lower limb, including hip: Secondary | ICD-10-CM | POA: Diagnosis not present

## 2019-03-28 DIAGNOSIS — D1801 Hemangioma of skin and subcutaneous tissue: Secondary | ICD-10-CM | POA: Diagnosis not present

## 2019-03-28 DIAGNOSIS — D225 Melanocytic nevi of trunk: Secondary | ICD-10-CM | POA: Diagnosis not present

## 2019-03-28 DIAGNOSIS — L821 Other seborrheic keratosis: Secondary | ICD-10-CM | POA: Diagnosis not present

## 2019-04-08 ENCOUNTER — Ambulatory Visit: Payer: Medicare Other

## 2019-04-23 ENCOUNTER — Encounter: Payer: Self-pay | Admitting: Adult Health

## 2019-04-23 ENCOUNTER — Ambulatory Visit: Payer: Medicare Other

## 2019-04-23 ENCOUNTER — Ambulatory Visit (INDEPENDENT_AMBULATORY_CARE_PROVIDER_SITE_OTHER): Payer: Medicare Other | Admitting: Adult Health

## 2019-04-23 ENCOUNTER — Other Ambulatory Visit: Payer: Self-pay

## 2019-04-23 VITALS — BP 146/72 | Temp 97.9°F | Wt 189.0 lb

## 2019-04-23 DIAGNOSIS — E119 Type 2 diabetes mellitus without complications: Secondary | ICD-10-CM | POA: Diagnosis not present

## 2019-04-23 DIAGNOSIS — I10 Essential (primary) hypertension: Secondary | ICD-10-CM | POA: Diagnosis not present

## 2019-04-23 DIAGNOSIS — E039 Hypothyroidism, unspecified: Secondary | ICD-10-CM | POA: Diagnosis not present

## 2019-04-23 DIAGNOSIS — E782 Mixed hyperlipidemia: Secondary | ICD-10-CM

## 2019-04-23 LAB — COMPREHENSIVE METABOLIC PANEL
ALT: 14 U/L (ref 0–35)
AST: 17 U/L (ref 0–37)
Albumin: 4.3 g/dL (ref 3.5–5.2)
Alkaline Phosphatase: 61 U/L (ref 39–117)
BUN: 44 mg/dL — ABNORMAL HIGH (ref 6–23)
CO2: 27 mEq/L (ref 19–32)
Calcium: 10.2 mg/dL (ref 8.4–10.5)
Chloride: 106 mEq/L (ref 96–112)
Creatinine, Ser: 0.93 mg/dL (ref 0.40–1.20)
GFR: 59.07 mL/min — ABNORMAL LOW (ref >60.00)
Glucose, Bld: 78 mg/dL (ref 70–99)
Potassium: 4.6 mEq/L (ref 3.5–5.1)
Sodium: 141 mEq/L (ref 135–145)
Total Bilirubin: 0.3 mg/dL (ref 0.2–1.2)
Total Protein: 7 g/dL (ref 6.0–8.3)

## 2019-04-23 LAB — POCT GLYCOSYLATED HEMOGLOBIN (HGB A1C): HbA1c, POC (controlled diabetic range): 5.6 % (ref 0.0–7.0)

## 2019-04-23 LAB — LIPID PANEL
Cholesterol: 153 mg/dL (ref 0–200)
HDL: 57.4 mg/dL (ref >39.00)
LDL Cholesterol: 81 mg/dL (ref 0–99)
NonHDL: 95.29
Total CHOL/HDL Ratio: 3
Triglycerides: 71 mg/dL (ref 0.0–149.0)
VLDL: 14.2 mg/dL (ref 0.0–40.0)

## 2019-04-23 LAB — CBC WITH DIFFERENTIAL/PLATELET
Basophils Absolute: 0.1 10*3/uL (ref 0.0–0.1)
Basophils Relative: 1.3 % (ref 0.0–3.0)
Eosinophils Absolute: 0.2 10*3/uL (ref 0.0–0.7)
Eosinophils Relative: 3.1 % (ref 0.0–5.0)
HCT: 37.7 % (ref 36.0–46.0)
Hemoglobin: 12.3 g/dL (ref 12.0–15.0)
Lymphocytes Relative: 31.9 % (ref 12.0–46.0)
Lymphs Abs: 2.2 10*3/uL (ref 0.7–4.0)
MCHC: 32.5 g/dL (ref 30.0–36.0)
MCV: 95.9 fl (ref 78.0–100.0)
Monocytes Absolute: 0.5 10*3/uL (ref 0.1–1.0)
Monocytes Relative: 7.3 % (ref 3.0–12.0)
Neutro Abs: 3.8 10*3/uL (ref 1.4–7.7)
Neutrophils Relative %: 56.4 % (ref 43.0–77.0)
Platelets: 349 10*3/uL (ref 150.0–400.0)
RBC: 3.93 Mil/uL (ref 3.87–5.11)
RDW: 13.8 % (ref 11.5–15.5)
WBC: 6.8 10*3/uL (ref 4.0–10.5)

## 2019-04-23 LAB — TSH: TSH: 1.3 u[IU]/mL (ref 0.35–4.50)

## 2019-04-23 MED ORDER — LISINOPRIL 30 MG PO TABS: 30.0000 mg | ORAL_TABLET | Freq: Every day | ORAL | 3 refills | Status: DC

## 2019-04-23 NOTE — Patient Instructions (Signed)
It was great seeing you today!   We will follow up with you regarding your blood work   Continue to eat healthy and exercise - you have done great!   Please let me know if you need anything    I have increased your lisinopril to 30 mg   I have also d/c Glipizide

## 2019-04-23 NOTE — Progress Notes (Signed)
Subjective:    Patient ID: Caroline Suarez, female    DOB: 12-28-1945, 73 y.o.   MRN: [id number]  HPI Patient presents for yearly preventative medicine examination. She is a pleasant 73 year old female who  has a past medical history of Arthritis, Asthma, Chicken pox, Heart murmur, Hyperlipidemia, Hypertension, Hypothyroidism, Osteoporosis, and Positive TB test.  DM - is currently prescribed glipizide 2.5 mg BID and Metformin 1000 mg BID.  She denies feelings of hypoglycemia Lab Results  Component Value Date   HGBA1C 5.8 10/23/2018   Hypertension - Currently prescribed lisinopril 10 mg daily. Over the last year she has been working on weight loss and has been able to lose about 60 pounds. She was experiencing episodes of hypotension and Norvasc was d/c in 02/12/2019. She has been monitoring her BP at home and reports readings consistently in the 130s to 150s over 70s to 80s.  She was noting that her blood pressure was going up so she increase lisinopril from 10 mg to 20 mg, even though at this increase her blood pressure stayed elevated.  She denies chest pain, shortness of breath, headaches, dizziness, or lightheadedness  Hyperlipidemia - currently prescribed Lipitor 10 mg   Hypothyroidism - Takes Synthroid 88 mg daily.   Osteoporosis - Takes Fosamax 70 mg weekly - has bone density next week.   All immunizations and health maintenance protocols were reviewed with the patient and needed orders were placed.  Appropriate screening laboratory values were ordered for the patient including screening of hyperlipidemia, renal function and hepatic function.  Medication reconciliation,  past medical history, social history, problem list and allergies were reviewed in detail with the patient  Goals were established with regard to weight loss, exercise, and  diet in compliance with medications.  She has been exercising on a routine basis and eating a heart healthy diet.  Over the last 2 years she has  been able to lose significant weight.  Wt Readings from Last 10 Encounters:  04/23/19 189 lb (85.7 kg)  10/23/18 196 lb (88.9 kg)  07/12/18 227 lb (103 kg)  04/03/18 243 lb (110.2 kg)  04/03/18 243 lb (110.2 kg)  08/02/17 250 lb (113.4 kg)  05/03/17 254 lb (115.2 kg)   End of life planning was discussed.  She is having a mammogram and bone density screen done next week.  She is up-to-date on colonoscopy.  Review of Systems  Constitutional: Negative.   HENT: Negative.   Eyes: Negative.   Respiratory: Negative.   Cardiovascular: Negative.   Gastrointestinal: Negative.   Endocrine: Negative.   Genitourinary: Negative.   Musculoskeletal: Negative.   Skin: Negative.   Allergic/Immunologic: Negative.   Neurological: Negative.   Hematological: Negative.   Psychiatric/Behavioral: Negative.    Past Medical History:  Diagnosis Date   Arthritis    Asthma    Chicken pox    Heart murmur    Hyperlipidemia    Hypertension    Hypothyroidism    Osteoporosis    Positive TB test     Social History   Socioeconomic History   Marital status: Widowed    Spouse name: Not on file   Number of children: Not on file   Years of education: Not on file   Highest education level: Not on file  Occupational History   Not on file  Social Needs   Financial resource strain: Not on file   Food insecurity    Worry: Not on file    Inability: Not  on file   Transportation needs    Medical: Not on file    Non-medical: Not on file  Tobacco Use   Smoking status: Never Smoker   Smokeless tobacco: Never Used  Substance and Sexual Activity   Alcohol use: No   Drug use: No   Sexual activity: Not on file  Lifestyle   Physical activity    Days per week: Not on file    Minutes per session: Not on file   Stress: Not on file  Relationships   Social connections    Talks on phone: Not on file    Gets together: Not on file    Attends religious service: Not on file     Active member of club or organization: Not on file    Attends meetings of clubs or organizations: Not on file    Relationship status: Not on file   Intimate partner violence    Fear of current or ex partner: Not on file    Emotionally abused: Not on file    Physically abused: Not on file    Forced sexual activity: Not on file  Other Topics Concern   Not on file  Social History Narrative   Retired Therapist, sports     Past Surgical History:  Procedure Laterality Date   BREAST BIOPSY     multiple. None came back positive    FOOT SURGERY Left 2002    Family History  Problem Relation Age of Onset   Heart disease Mother    Stroke Mother    Hyperlipidemia Mother    Pulmonary fibrosis Father     Allergies  Allergen Reactions   Codeine Nausea And Vomiting    Current Outpatient Medications on File Prior to Visit  Medication Sig Dispense Refill   albuterol (PROAIR HFA) 108 (90 Base) MCG/ACT inhaler Inhale 1-2 puffs every 6 (six) hours as needed into the lungs. 6.7 g 3   alendronate (FOSAMAX) 70 MG tablet Take 1 tablet (70 mg total) by mouth once a week. Take with a full glass of water on an empty stomach. 12 tablet 3   aspirin EC 81 MG tablet Take 81 mg by mouth daily.     atorvastatin (LIPITOR) 10 MG tablet TAKE 2 TABLETS BY MOUTH DAILY 180 tablet 0   fexofenadine (ALLEGRA) 180 MG tablet Take 180 mg by mouth daily.     glipiZIDE (GLUCOTROL) 5 MG tablet TAKE 1/2 TABLET(2.5 MG) BY MOUTH TWICE DAILY BEFORE A MEAL 90 tablet 0   levothyroxine (SYNTHROID) 88 MCG tablet TAKE 1 TABLET BY MOUTH DAILY 90 tablet 0   lisinopril (ZESTRIL) 10 MG tablet Take 1 tablet (10 mg total) by mouth daily. 90 tablet 1   metFORMIN (GLUCOPHAGE) 1000 MG tablet TAKE 1 TABLET(1000 MG) BY MOUTH TWICE DAILY WITH A MEAL 180 tablet 1   thiamine 100 MG tablet Take 100 mg by mouth daily.     valACYclovir (VALTREX) 1000 MG tablet TK 2 TS PO BID 90 tablet 3   No current facility-administered medications on  file prior to visit.     BP (!) 146/72    Temp 97.9 F (36.6 C)    Wt 189 lb (85.7 kg)    BMI 34.57 kg/m       Objective:   Physical Exam Vitals signs and nursing note reviewed.  Constitutional:      General: She is not in acute distress.    Appearance: Normal appearance. She is well-developed.  HENT:  Head: Normocephalic and atraumatic.     Right Ear: Tympanic membrane, ear canal and external ear normal. There is no impacted cerumen.     Left Ear: Tympanic membrane, ear canal and external ear normal. There is no impacted cerumen.     Nose: Nose normal.     Mouth/Throat:     Mouth: Mucous membranes are moist.     Pharynx: Oropharynx is clear. No oropharyngeal exudate.  Eyes:     General:        Right eye: No discharge.        Left eye: No discharge.     Conjunctiva/sclera: Conjunctivae normal.     Pupils: Pupils are equal, round, and reactive to light.  Neck:     Musculoskeletal: Normal range of motion and neck supple.     Thyroid: No thyromegaly.     Trachea: No tracheal deviation.  Cardiovascular:     Rate and Rhythm: Normal rate and regular rhythm.     Pulses: Normal pulses.     Heart sounds: Normal heart sounds. No murmur. No friction rub. No gallop.   Pulmonary:     Effort: Pulmonary effort is normal. No respiratory distress.     Breath sounds: Normal breath sounds. No stridor. No wheezing, rhonchi or rales.  Chest:     Chest wall: No tenderness.  Abdominal:     General: Abdomen is flat. Bowel sounds are normal. There is no distension.     Palpations: Abdomen is soft. There is no mass.     Tenderness: There is no abdominal tenderness. There is no right CVA tenderness, left CVA tenderness, guarding or rebound.     Hernia: No hernia is present.  Musculoskeletal: Normal range of motion.        General: No swelling, tenderness, deformity or signs of injury.     Right lower leg: No edema.     Left lower leg: No edema.  Lymphadenopathy:     Cervical: No cervical  adenopathy.  Skin:    General: Skin is warm and dry.     Coloration: Skin is not jaundiced or pale.     Findings: No bruising, erythema, lesion or rash.  Neurological:     General: No focal deficit present.     Mental Status: She is alert and oriented to person, place, and time. Mental status is at baseline.     Cranial Nerves: No cranial nerve deficit.     Coordination: Coordination normal.  Psychiatric:        Mood and Affect: Mood normal.        Behavior: Behavior normal.        Thought Content: Thought content normal.        Judgment: Judgment normal.        Assessment & Plan:  1. Type 2 diabetes mellitus without complication, without long-term current use of insulin (HCC) -She was congratulated on lifestyle modifications and significant weight loss. - POCT A1C- 5.6 -We will have her DC glipizide 2.5 mg twice daily.  Continue with metformin 1000 mg twice daily.  We will have her follow-up in 6 months or sooner if needed - CBC with Differential/Platelet - Comprehensive metabolic panel - Lipid panel - TSH  2. Mixed hyperlipidemia -Consider dose change of Lipitor - CBC with Differential/Platelet - Comprehensive metabolic panel - Lipid panel - TSH  3. Hypothyroidism, unspecified type -Sitter dose change of Synthroid - CBC with Differential/Platelet - Comprehensive metabolic panel - Lipid panel - TSH  4.  Essential hypertension -We will increase lisinopril from 20 mg to 30 mg tablets.  She was advised to continue to monitor blood pressure and follow-up if blood pressure readings continue to be elevated - lisinopril (ZESTRIL) 30 MG tablet; Take 1 tablet (30 mg total) by mouth daily.  Dispense: 90 tablet; Refill: 3 - CBC with Differential/Platelet - Comprehensive metabolic panel - Lipid panel - TSH   Dorothyann Peng, NP

## 2019-04-29 DIAGNOSIS — N958 Other specified menopausal and perimenopausal disorders: Secondary | ICD-10-CM | POA: Diagnosis not present

## 2019-04-29 DIAGNOSIS — Z1231 Encounter for screening mammogram for malignant neoplasm of breast: Secondary | ICD-10-CM | POA: Diagnosis not present

## 2019-04-29 DIAGNOSIS — M8588 Other specified disorders of bone density and structure, other site: Secondary | ICD-10-CM | POA: Diagnosis not present

## 2019-04-29 DIAGNOSIS — Z6834 Body mass index (BMI) 34.0-34.9, adult: Secondary | ICD-10-CM | POA: Diagnosis not present

## 2019-04-29 DIAGNOSIS — Z01419 Encounter for gynecological examination (general) (routine) without abnormal findings: Secondary | ICD-10-CM | POA: Diagnosis not present

## 2019-04-29 LAB — HM DEXA SCAN

## 2019-05-02 ENCOUNTER — Encounter: Payer: Self-pay | Admitting: Adult Health

## 2019-05-24 ENCOUNTER — Other Ambulatory Visit: Payer: Self-pay | Admitting: Adult Health

## 2019-05-24 DIAGNOSIS — E119 Type 2 diabetes mellitus without complications: Secondary | ICD-10-CM

## 2019-05-28 NOTE — Telephone Encounter (Signed)
Sent to the pharmacy by e-scribe. 

## 2019-05-28 NOTE — Telephone Encounter (Signed)
Patient is calling to check status of this request.

## 2019-08-26 ENCOUNTER — Other Ambulatory Visit: Payer: Self-pay | Admitting: Adult Health

## 2019-08-26 DIAGNOSIS — E119 Type 2 diabetes mellitus without complications: Secondary | ICD-10-CM

## 2019-08-26 MED ORDER — METFORMIN HCL 1000 MG PO TABS: ORAL_TABLET | ORAL | 0 refills | Status: DC

## 2019-08-26 NOTE — Telephone Encounter (Signed)
Medication: metFORMIN (GLUCOPHAGE) 1000 MG tablet [id number]   Has the patient contacted their pharmacy/ Yes  (Agent: If no, request that the patient contact the pharmacy for the refill.) (Agent: If yes, when and what did the pharmacy advise?)  Preferred Pharmacy (with phone number or street name): [facility] [place], [place] - [address] [phone] (Phone) [phone] (Fax)    Agent: Please be advised that RX refills may take up to 3 business days. We ask that you follow-up with your pharmacy.

## 2019-10-24 ENCOUNTER — Ambulatory Visit: Payer: Medicare Other | Admitting: Adult Health

## 2019-11-09 ENCOUNTER — Ambulatory Visit: Payer: Medicare Other | Attending: Internal Medicine

## 2019-11-09 DIAGNOSIS — Z23 Encounter for immunization: Secondary | ICD-10-CM | POA: Insufficient documentation

## 2019-11-09 NOTE — Progress Notes (Signed)
   Covid-19 Vaccination Clinic  Name:  Caroline Suarez    MRN: [id number] DOB: [birth year]  11/09/2019  Caroline Suarez was observed post Covid-19 immunization for 15 minutes without incidence. She was provided with Vaccine Information Sheet and instruction to access the V-Safe system.   Caroline Suarez was instructed to call 911 with any severe reactions post vaccine: Marland Kitchen Difficulty breathing  . Swelling of your face and throat  . A fast heartbeat  . A bad rash all over your body  . Dizziness and weakness    Immunizations Administered    Name Date Dose VIS Date Route   Pfizer COVID-19 Vaccine 11/09/2019  2:48 PM 0.3 mL 09/06/2019 Intramuscular   Manufacturer: [place]   Lot: [id number]   [place]: [id number]

## 2019-11-20 ENCOUNTER — Other Ambulatory Visit: Payer: Self-pay | Admitting: Adult Health

## 2019-11-20 DIAGNOSIS — E119 Type 2 diabetes mellitus without complications: Secondary | ICD-10-CM

## 2019-11-20 NOTE — Telephone Encounter (Signed)
Ok to fill until then

## 2019-11-20 NOTE — Telephone Encounter (Signed)
Pt was due in Jan for A1C FU.  She is scheduled for 12/24/2019.  Ok to fill or should pt come in sooner?

## 2019-11-20 NOTE — Telephone Encounter (Signed)
Sent to the pharmacy by e-scribe for 2 months.  Enough to get her to appt.

## 2019-12-02 ENCOUNTER — Ambulatory Visit: Payer: Medicare Other | Attending: Internal Medicine

## 2019-12-02 DIAGNOSIS — Z23 Encounter for immunization: Secondary | ICD-10-CM | POA: Insufficient documentation

## 2019-12-02 NOTE — Progress Notes (Signed)
   Covid-19 Vaccination Clinic  Name:  Caroline Suarez    MRN: [id number] DOB: [birth year]  12/02/2019  [name] was observed post Covid-19 immunization for 15 minutes without incident. She was provided with Vaccine Information Sheet and instruction to access the V-Safe system.   [name] was instructed to call 911 with any severe reactions post vaccine: Marland Kitchen Difficulty breathing  . Swelling of face and throat  . A fast heartbeat  . A bad rash all over body  . Dizziness and weakness   Immunizations Administered    Name Date Dose VIS Date Route   Pfizer COVID-19 Vaccine 12/02/2019  2:48 PM 0.3 mL 09/06/2019 Intramuscular   Manufacturer: [place]   Lot: [id number]   [place]: [id number]

## 2019-12-17 ENCOUNTER — Telehealth: Payer: Self-pay | Admitting: Adult Health

## 2019-12-17 DIAGNOSIS — E119 Type 2 diabetes mellitus without complications: Secondary | ICD-10-CM

## 2019-12-17 NOTE — Telephone Encounter (Signed)
Pt would like to get her METFORMIN refilled for 90 days. She states that she has to constantly call in to fix this and would like to continue receiving this as a 90 day supply.   Medication Refill: METFORMIN  Pharmacy: [facility] and [place]: [phone]

## 2019-12-17 NOTE — Telephone Encounter (Signed)
Spoke to the pt.  Pt requesting more than 90 days sent to the pharmacy.  Advised that I could not send in more than 1 refill since she is past due for her appointment.  She has enough medication to last until seen next week.  She was afraid that she will forget to ask for a refill while here.  Will note her appointment.  Nothing further needed.

## 2019-12-24 ENCOUNTER — Encounter: Payer: Self-pay | Admitting: Adult Health

## 2019-12-24 ENCOUNTER — Other Ambulatory Visit: Payer: Self-pay

## 2019-12-24 ENCOUNTER — Ambulatory Visit (INDEPENDENT_AMBULATORY_CARE_PROVIDER_SITE_OTHER): Payer: Medicare Other | Admitting: Adult Health

## 2019-12-24 VITALS — BP 120/74 | Temp 98.0°F | Wt 192.0 lb

## 2019-12-24 DIAGNOSIS — E119 Type 2 diabetes mellitus without complications: Secondary | ICD-10-CM | POA: Diagnosis not present

## 2019-12-24 LAB — POCT GLYCOSYLATED HEMOGLOBIN (HGB A1C): HbA1c, POC (controlled diabetic range): 5.9 % (ref 0.0–7.0)

## 2019-12-24 MED ORDER — METFORMIN HCL 1000 MG PO TABS: 1000.0000 mg | ORAL_TABLET | Freq: Every day | ORAL | 1 refills | Status: DC

## 2019-12-24 NOTE — Progress Notes (Signed)
Subjective:    Patient ID: Caroline Suarez, female    DOB: June 28, 1946, 74 y.o.   MRN: [id number]  HPI 74 year old female who  has a past medical history of Arthritis, Asthma, Chicken pox, Heart murmur, Hyperlipidemia, Hypertension, Hypothyroidism, Osteoporosis, and Positive TB test.  She presents to the office today for six month follow up regarding DM. She is currently maintained on Metformin 1000 mg BID. During her last visit she was able to come off glipizide as the result of lifestyle modifications.  She continues to eat healthy but has not been exercising as much as she would like.  Since she has received the Covid vaccination [facility]  She has not not had any episodes of hypoglycemia.  Her last A1c in July 2020 was 5.6.  Lab Results  Component Value Date   HGBA1C 5.9 12/24/2019    Wt Readings from Last 10 Encounters:  12/24/19 192 lb (87.1 kg)  04/23/19 189 lb (85.7 kg)  10/23/18 196 lb (88.9 kg)  07/12/18 227 lb (103 kg)  04/03/18 243 lb (110.2 kg)  04/03/18 243 lb (110.2 kg)  08/02/17 250 lb (113.4 kg)  05/03/17 254 lb (115.2 kg)   Review of Systems See HPI   Past Medical History:  Diagnosis Date  . Arthritis   . Asthma   . Chicken pox   . Heart murmur   . Hyperlipidemia   . Hypertension   . Hypothyroidism   . Osteoporosis   . Positive TB test     Social History   Socioeconomic History  . Marital status: Widowed    Spouse name: Not on file  . Number of children: Not on file  . Years of education: Not on file  . Highest education level: Not on file  Occupational History  . Not on file  Tobacco Use  . Smoking status: Never Smoker  . Smokeless tobacco: Never Used  Substance and Sexual Activity  . Alcohol use: No  . Drug use: No  . Sexual activity: Not on file  Other Topics Concern  . Not on file  Social History Narrative   Retired Therapist, sports    Social Determinants of Radio broadcast assistant Strain:   .  Difficulty of Paying Living Expenses:   Food Insecurity:   . Worried About Charity fundraiser in the Last Year:   . Arboriculturist in the Last Year:   Transportation Needs:   . Film/video editor (Medical):   Marland Kitchen Lack of Transportation (Non-Medical):   Physical Activity:   . Days of Exercise per Week:   . Minutes of Exercise per Session:   Stress:   . Feeling of Stress :   Social Connections:   . Frequency of Communication with Friends and Family:   . Frequency of Social Gatherings with Friends and Family:   . Attends Religious Services:   . Active Member of Clubs or Organizations:   . Attends Archivist Meetings:   Marland Kitchen Marital Status:   Intimate Partner Violence:   . Fear of Current or Ex-Partner:   . Emotionally Abused:   Marland Kitchen Physically Abused:   . Sexually Abused:     Past Surgical History:  Procedure Laterality Date  . BREAST BIOPSY     multiple. None came back positive   . FOOT SURGERY Left 2002    Family History  Problem Relation Age of Onset  .  Heart disease Mother   . Stroke Mother   . Hyperlipidemia Mother   . Pulmonary fibrosis Father     Allergies  Allergen Reactions  . Codeine Nausea And Vomiting    Current Outpatient Medications on File Prior to Visit  Medication Sig Dispense Refill  . albuterol (PROAIR HFA) 108 (90 Base) MCG/ACT inhaler Inhale 1-2 puffs every 6 (six) hours as needed into the lungs. 6.7 g 3  . alendronate (FOSAMAX) 70 MG tablet Take 1 tablet (70 mg total) by mouth once a week. Take with a full glass of water on an empty stomach. 12 tablet 3  . aspirin EC 81 MG tablet Take 81 mg by mouth daily.    Marland Kitchen atorvastatin (LIPITOR) 10 MG tablet TAKE 2 TABLETS BY MOUTH DAILY 180 tablet 3  . Calcium-Vitamin D-Vitamin K (VIACTIV CALCIUM PLUS D) 650-12.5-40 MG-MCG-MCG CHEW     . fexofenadine (ALLEGRA) 180 MG tablet Take 180 mg by mouth daily.    Marland Kitchen levothyroxine (SYNTHROID) 88 MCG tablet TAKE 1 TABLET BY MOUTH DAILY 90 tablet 3  .  lisinopril (ZESTRIL) 30 MG tablet Take 1 tablet (30 mg total) by mouth daily. 90 tablet 3  . metFORMIN (GLUCOPHAGE) 1000 MG tablet TAKE 1 TABLET(1000 MG) BY MOUTH TWICE DAILY WITH MEALS 60 tablet 1  . valACYclovir (VALTREX) 1000 MG tablet TK 2 TS PO BID 90 tablet 3   No current facility-administered medications on file prior to visit.    BP 120/74   Temp 98 F (36.7 C)   Wt 192 lb (87.1 kg)   BMI 35.12 kg/m       Objective:   Physical Exam Vitals and nursing note reviewed.  Constitutional:      Appearance: Normal appearance.  Skin:    General: Skin is warm and dry.     Capillary Refill: Capillary refill takes less than 2 seconds.  Neurological:     General: No focal deficit present.     Mental Status: She is alert and oriented to person, place, and time.  Psychiatric:        Mood and Affect: Mood normal.        Behavior: Behavior normal.        Thought Content: Thought content normal.        Judgment: Judgment normal.       Assessment & Plan:  1. Type 2 diabetes mellitus without complication, without long-term current use of insulin (HCC)  - POCT A1C- 5.9  -Have her decrease Metformin from 1000 mg twice daily to 1000 mg daily with breakfast.  She will follow-up for her CPE in July and we will retest then. - metFORMIN (GLUCOPHAGE) 1000 MG tablet; Take 1 tablet (1,000 mg total) by mouth daily with breakfast. TAKE 1 TABLET(1000 MG) BY MOUTH TWICE DAILY WITH MEALS  Dispense: 90 tablet; Refill: 1  Dorothyann Peng, NP

## 2019-12-24 NOTE — Patient Instructions (Addendum)
Your A1c is 5.9 - lets go down on Metformin to 1000 mg daily.   Follow up for physical exam after July 28th.

## 2019-12-25 ENCOUNTER — Other Ambulatory Visit: Payer: Self-pay | Admitting: Family Medicine

## 2019-12-25 MED ORDER — ALENDRONATE SODIUM 70 MG PO TABS: 70.0000 mg | ORAL_TABLET | ORAL | 0 refills | Status: DC

## 2019-12-25 NOTE — Telephone Encounter (Signed)
Sent to the pharmacy by e-scribe. 

## 2019-12-26 DIAGNOSIS — E119 Type 2 diabetes mellitus without complications: Secondary | ICD-10-CM | POA: Diagnosis not present

## 2019-12-26 DIAGNOSIS — H25813 Combined forms of age-related cataract, bilateral: Secondary | ICD-10-CM | POA: Diagnosis not present

## 2020-01-07 ENCOUNTER — Encounter: Payer: Self-pay | Admitting: Adult Health

## 2020-01-17 ENCOUNTER — Encounter: Payer: Self-pay | Admitting: Family Medicine

## 2020-01-30 DIAGNOSIS — E119 Type 2 diabetes mellitus without complications: Secondary | ICD-10-CM | POA: Diagnosis not present

## 2020-01-30 DIAGNOSIS — H25813 Combined forms of age-related cataract, bilateral: Secondary | ICD-10-CM | POA: Diagnosis not present

## 2020-02-25 ENCOUNTER — Other Ambulatory Visit: Payer: Self-pay | Admitting: Family Medicine

## 2020-02-25 DIAGNOSIS — E119 Type 2 diabetes mellitus without complications: Secondary | ICD-10-CM

## 2020-02-25 MED ORDER — METFORMIN HCL 1000 MG PO TABS: 1000.0000 mg | ORAL_TABLET | Freq: Every day | ORAL | 0 refills | Status: DC

## 2020-02-25 NOTE — Telephone Encounter (Signed)
SENT TO THE PHARMACY BY E-SCRIBE. 

## 2020-03-18 ENCOUNTER — Other Ambulatory Visit: Payer: Self-pay | Admitting: Family Medicine

## 2020-03-18 MED ORDER — ALENDRONATE SODIUM 70 MG PO TABS: 70.0000 mg | ORAL_TABLET | ORAL | 0 refills | Status: DC

## 2020-03-18 NOTE — Telephone Encounter (Signed)
SENT TO THE PHARMACY BY E-SCRIBE. 

## 2020-03-19 ENCOUNTER — Other Ambulatory Visit: Payer: Self-pay | Admitting: Adult Health

## 2020-03-19 DIAGNOSIS — E119 Type 2 diabetes mellitus without complications: Secondary | ICD-10-CM

## 2020-03-23 ENCOUNTER — Telehealth: Payer: Self-pay | Admitting: Adult Health

## 2020-03-23 NOTE — Telephone Encounter (Signed)
Patient did not want to sched at this time

## 2020-04-22 ENCOUNTER — Other Ambulatory Visit: Payer: Self-pay | Admitting: Family Medicine

## 2020-04-22 DIAGNOSIS — E039 Hypothyroidism, unspecified: Secondary | ICD-10-CM

## 2020-04-22 DIAGNOSIS — I10 Essential (primary) hypertension: Secondary | ICD-10-CM

## 2020-04-22 DIAGNOSIS — E782 Mixed hyperlipidemia: Secondary | ICD-10-CM

## 2020-04-22 DIAGNOSIS — E119 Type 2 diabetes mellitus without complications: Secondary | ICD-10-CM

## 2020-04-22 MED ORDER — LISINOPRIL 30 MG PO TABS: 30.0000 mg | ORAL_TABLET | Freq: Every day | ORAL | 0 refills | Status: DC

## 2020-04-22 NOTE — Telephone Encounter (Signed)
Sent to the pharmacy by e-scribe for 90 days.  Pt has upcoming cpx. 

## 2020-04-23 ENCOUNTER — Other Ambulatory Visit: Payer: Self-pay | Admitting: Adult Health

## 2020-04-23 ENCOUNTER — Encounter: Payer: Self-pay | Admitting: Adult Health

## 2020-04-23 ENCOUNTER — Other Ambulatory Visit: Payer: Self-pay

## 2020-04-23 ENCOUNTER — Ambulatory Visit (INDEPENDENT_AMBULATORY_CARE_PROVIDER_SITE_OTHER): Payer: Medicare Other | Admitting: Adult Health

## 2020-04-23 VITALS — BP 124/78 | Temp 98.1°F | Ht 62.0 in | Wt 199.0 lb

## 2020-04-23 DIAGNOSIS — M810 Age-related osteoporosis without current pathological fracture: Secondary | ICD-10-CM

## 2020-04-23 DIAGNOSIS — E782 Mixed hyperlipidemia: Secondary | ICD-10-CM

## 2020-04-23 DIAGNOSIS — E119 Type 2 diabetes mellitus without complications: Secondary | ICD-10-CM | POA: Diagnosis not present

## 2020-04-23 DIAGNOSIS — E039 Hypothyroidism, unspecified: Secondary | ICD-10-CM

## 2020-04-23 DIAGNOSIS — I10 Essential (primary) hypertension: Secondary | ICD-10-CM | POA: Diagnosis not present

## 2020-04-23 MED ORDER — LISINOPRIL 30 MG PO TABS: 30.0000 mg | ORAL_TABLET | Freq: Every day | ORAL | 3 refills | Status: DC

## 2020-04-23 NOTE — Progress Notes (Signed)
Subjective:    Patient ID: Caroline Suarez, female    DOB: March 30, 1946, 74 y.o.   MRN: [id number]  HPI Patient presents for yearly preventative medicine examination. She is a pleasant 74 year old female who  has a past medical history of Arthritis, Asthma, Chicken pox, Heart murmur, Hyperlipidemia, Hypertension, Hypothyroidism, Osteoporosis, and Positive TB test.  DM -currently maintained on Metformin 1000 mg daily.  During her last visit her Metformin was dropped from 1000 mg twice daily to daily dosing.  Through diet and exercise she has been able to come off glipizide as well Lab Results  Component Value Date   HGBA1C 5.9 12/24/2019   Hyperlipidemia - takes Lipitor 10 mg daily.  Lab Results  Component Value Date   CHOL 153 04/23/2019   HDL 57.40 04/23/2019   LDLCALC 81 04/23/2019   TRIG 71.0 04/23/2019   CHOLHDL 3 04/23/2019   Hypothyroidism - controlled with synthroid 88 mcg daily   Osteoporosis - prescribed Fosamax 70 mg weekly. Her last bone density screen was in August 2020  Hypertension - controlled with lisinopril 30 mg daily  BP Readings from Last 3 Encounters:  04/23/20 124/78  12/24/19 120/74  04/23/19 (!) 146/72    All immunizations and health maintenance protocols were reviewed with the patient and needed orders were placed. She is up to date on routine vaccinations   Appropriate screening laboratory values were ordered for the patient including screening of hyperlipidemia, renal function and hepatic function.  Medication reconciliation,  past medical history, social history, problem list and allergies were reviewed in detail with the patient  Goals were established with regard to weight loss, exercise, and  diet in compliance with medications. She is doing water aerobics 5 x a week and is eating healthy.   Wt Readings from Last 10 Encounters:  04/23/20 199 lb (90.3 kg)  12/24/19 192 lb (87.1 kg)  04/23/19 189 lb (85.7 kg)  10/23/18 196 lb (88.9 kg)  07/12/18  227 lb (103 kg)  04/03/18 243 lb (110.2 kg)  04/03/18 243 lb (110.2 kg)  08/02/17 250 lb (113.4 kg)  05/03/17 254 lb (115.2 kg)    End of life planning was discussed. She has an advanced directive and living will.   She is up to date on routine colon cancer screening and will have her mammogram next week.   Review of Systems  Constitutional: Negative.   HENT: Negative.   Eyes: Negative.   Respiratory: Negative.   Cardiovascular: Negative.   Gastrointestinal: Negative.   Endocrine: Negative.   Genitourinary: Negative.   Musculoskeletal: Negative.   Skin: Negative.   Allergic/Immunologic: Negative.   Neurological: Negative.   Hematological: Negative.   Psychiatric/Behavioral: Negative.    Past Medical History:  Diagnosis Date   Arthritis    Asthma    Chicken pox    Heart murmur    Hyperlipidemia    Hypertension    Hypothyroidism    Osteoporosis    Positive TB test     Social History   Socioeconomic History   Marital status: Widowed    Spouse name: Not on file   Number of children: Not on file   Years of education: Not on file   Highest education level: Not on file  Occupational History   Not on file  Tobacco Use   Smoking status: Never Smoker   Smokeless tobacco: Never Used  Vaping Use   Vaping Use: Never used  Substance and Sexual Activity   Alcohol use:  No   Drug use: No   Sexual activity: Not on file  Other Topics Concern   Not on file  Social History Narrative   Retired Therapist, sports    Social Determinants of Radio broadcast assistant Strain:    Difficulty of Paying Living Expenses:   Food Insecurity:    Worried About Charity fundraiser in the Last Year:    Arboriculturist in the Last Year:   Transportation Needs:    Film/video editor (Medical):    Lack of Transportation (Non-Medical):   Physical Activity:    Days of Exercise per Week:    Minutes of Exercise per Session:   Stress:    Feeling of Stress :     Social Connections:    Frequency of Communication with Friends and Family:    Frequency of Social Gatherings with Friends and Family:    Attends Religious Services:    Active Member of Clubs or Organizations:    Attends Music therapist:    Marital Status:   Intimate Partner Violence:    Fear of Current or Ex-Partner:    Emotionally Abused:    Physically Abused:    Sexually Abused:     Past Surgical History:  Procedure Laterality Date   BREAST BIOPSY     multiple. None came back positive    FOOT SURGERY Left 2002    Family History  Problem Relation Age of Onset   Heart disease Mother    Stroke Mother    Hyperlipidemia Mother    Pulmonary fibrosis Father     Allergies  Allergen Reactions   Codeine Nausea And Vomiting    Current Outpatient Medications on File Prior to Visit  Medication Sig Dispense Refill   albuterol (PROAIR HFA) 108 (90 Base) MCG/ACT inhaler Inhale 1-2 puffs every 6 (six) hours as needed into the lungs. 6.7 g 3   alendronate (FOSAMAX) 70 MG tablet Take 1 tablet (70 mg total) by mouth once a week. Take with a full glass of water on an empty stomach. 12 tablet 0   aspirin EC 81 MG tablet Take 81 mg by mouth daily.     atorvastatin (LIPITOR) 10 MG tablet TAKE 2 TABLETS BY MOUTH DAILY 180 tablet 3   Calcium-Vitamin D-Vitamin K (VIACTIV CALCIUM PLUS D) 650-12.5-40 MG-MCG-MCG CHEW      fexofenadine (ALLEGRA) 180 MG tablet Take 180 mg by mouth daily.     levothyroxine (SYNTHROID) 88 MCG tablet TAKE 1 TABLET BY MOUTH DAILY 90 tablet 3   lisinopril (ZESTRIL) 30 MG tablet Take 1 tablet (30 mg total) by mouth daily. 90 tablet 0   metFORMIN (GLUCOPHAGE) 1000 MG tablet TAKE 1 TABLET(1000 MG) BY MOUTH DAILY WITH BREAKFAST 90 tablet 0   valACYclovir (VALTREX) 1000 MG tablet TK 2 TS PO BID 90 tablet 3   No current facility-administered medications on file prior to visit.    BP 124/78    Temp 98.1 F (36.7 C)    Ht _0   (1.575 m) Comment: WITHOUT SHOES   Wt 199 lb (90.3 kg)    BMI 36.40 kg/m       Objective:   Physical Exam Vitals and nursing note reviewed.  Constitutional:      General: She is not in acute distress.    Appearance: Normal appearance. She is well-developed. She is obese. She is not ill-appearing.  HENT:     Head: Normocephalic and atraumatic.     Right Ear:  Tympanic membrane, ear canal and external ear normal. There is no impacted cerumen.     Left Ear: Tympanic membrane, ear canal and external ear normal. There is no impacted cerumen.     Nose: Nose normal. No congestion or rhinorrhea.     Mouth/Throat:     Mouth: Mucous membranes are moist.     Pharynx: Oropharynx is clear. No oropharyngeal exudate or posterior oropharyngeal erythema.  Eyes:     General:        Right eye: No discharge.        Left eye: No discharge.     Extraocular Movements: Extraocular movements intact.     Conjunctiva/sclera: Conjunctivae normal.     Pupils: Pupils are equal, round, and reactive to light.  Neck:     Thyroid: No thyromegaly.     Vascular: No carotid bruit.     Trachea: No tracheal deviation.  Cardiovascular:     Rate and Rhythm: Normal rate and regular rhythm.     Pulses: Normal pulses.     Heart sounds: Normal heart sounds. No murmur heard.  No friction rub. No gallop.   Pulmonary:     Effort: Pulmonary effort is normal. No respiratory distress.     Breath sounds: Normal breath sounds. No stridor. No wheezing, rhonchi or rales.  Chest:     Chest wall: No tenderness.  Abdominal:     General: Abdomen is flat. Bowel sounds are normal. There is no distension.     Palpations: Abdomen is soft. There is no mass.     Tenderness: There is no abdominal tenderness. There is no right CVA tenderness, left CVA tenderness, guarding or rebound.     Hernia: No hernia is present.  Musculoskeletal:        General: No swelling, tenderness, deformity or signs of injury. Normal range of motion.      Cervical back: Normal range of motion and neck supple.     Right lower leg: No edema.     Left lower leg: No edema.  Lymphadenopathy:     Cervical: No cervical adenopathy.  Skin:    General: Skin is warm and dry.     Coloration: Skin is not jaundiced or pale.     Findings: No bruising, erythema, lesion or rash.  Neurological:     General: No focal deficit present.     Mental Status: She is alert and oriented to person, place, and time.     Cranial Nerves: No cranial nerve deficit.     Sensory: No sensory deficit.     Motor: No weakness.     Coordination: Coordination normal.     Gait: Gait normal.     Deep Tendon Reflexes: Reflexes normal.  Psychiatric:        Mood and Affect: Mood normal.        Behavior: Behavior normal.        Thought Content: Thought content normal.        Judgment: Judgment normal.       Assessment & Plan:  1. Mixed hyperlipidemia - Consider dose change of synthroid  - CMP with eGFR([facility]); Future - CBC with Differential/Platelet; Future - Hemoglobin A1c; Future - Lipid panel; Future - TSH; Future  2. Hypothyroidism, unspecified type - Consider dose change of synthroid  - CMP with eGFR([facility]); Future - CBC with Differential/Platelet; Future - Hemoglobin A1c; Future - Lipid panel; Future - TSH; Future  3. Type 2 diabetes mellitus without complication, without long-term current use  of insulin ([place]) - Consider dose change of metformin  3-6 month follow up  - CMP with eGFR([facility]); Future - CBC with Differential/Platelet; Future - Hemoglobin A1c; Future - Lipid panel; Future - TSH; Future  4. Essential hypertension - well controlled. No change in medications  - CMP with eGFR([facility]); Future - CBC with Differential/Platelet; Future - Hemoglobin A1c; Future - Lipid panel; Future - TSH; Future - lisinopril (ZESTRIL) 30 MG tablet; Take 1 tablet (30 mg total) by mouth daily.  Dispense: 90 tablet; Refill: 3  5. Osteoporosis without current  pathological fracture, unspecified osteoporosis type  - Vitamin D, 25-hydroxy; Future  Dorothyann Peng, NP

## 2020-04-24 ENCOUNTER — Other Ambulatory Visit: Payer: Self-pay | Admitting: Adult Health

## 2020-04-24 DIAGNOSIS — N289 Disorder of kidney and ureter, unspecified: Secondary | ICD-10-CM

## 2020-04-24 LAB — COMPLETE METABOLIC PANEL WITH GFR
AG Ratio: 1.7 (calc) (ref 1.0–2.5)
ALT: 13 U/L (ref 6–29)
AST: 16 U/L (ref 10–35)
Albumin: 4.3 g/dL (ref 3.6–5.1)
Alkaline phosphatase (APISO): 66 U/L (ref 37–153)
BUN/Creatinine Ratio: 54 (calc) — ABNORMAL HIGH (ref 6–22)
BUN: 58 mg/dL — ABNORMAL HIGH (ref 7–25)
CO2: 22 mmol/L (ref 20–32)
Calcium: 9.7 mg/dL (ref 8.6–10.4)
Chloride: 104 mmol/L (ref 98–110)
Creat: 1.08 mg/dL — ABNORMAL HIGH (ref 0.60–0.93)
GFR, Est African American: 59 mL/min/{1.73_m2} — ABNORMAL LOW (ref >=60)
GFR, Est Non African American: 51 mL/min/{1.73_m2} — ABNORMAL LOW (ref >=60)
Globulin: 2.6 g/dL (calc) (ref 1.9–3.7)
Glucose, Bld: 134 mg/dL — ABNORMAL HIGH (ref 65–99)
Potassium: 4.6 mmol/L (ref 3.5–5.3)
Sodium: 136 mmol/L (ref 135–146)
Total Bilirubin: 0.2 mg/dL (ref 0.2–1.2)
Total Protein: 6.9 g/dL (ref 6.1–8.1)

## 2020-04-24 LAB — CBC WITH DIFFERENTIAL/PLATELET
Absolute Monocytes: 639 cells/uL (ref 200–950)
Basophils Absolute: 69 cells/uL (ref 0–200)
Basophils Relative: 0.9 %
Eosinophils Absolute: 223 cells/uL (ref 15–500)
Eosinophils Relative: 2.9 %
HCT: 37.5 % (ref 35.0–45.0)
Hemoglobin: 12.2 g/dL (ref 11.7–15.5)
Lymphs Abs: 2564 cells/uL (ref 850–3900)
MCH: 31.4 pg (ref 27.0–33.0)
MCHC: 32.5 g/dL (ref 32.0–36.0)
MCV: 96.6 fL (ref 80.0–100.0)
MPV: 9.6 fL (ref 7.5–12.5)
Monocytes Relative: 8.3 %
Neutro Abs: 4204 cells/uL (ref 1500–7800)
Neutrophils Relative %: 54.6 %
Platelets: 374 10*3/uL (ref 140–400)
RBC: 3.88 10*6/uL (ref 3.80–5.10)
RDW: 11.9 % (ref 11.0–15.0)
Total Lymphocyte: 33.3 %
WBC: 7.7 10*3/uL (ref 3.8–10.8)

## 2020-04-24 LAB — LIPID PANEL
Cholesterol: 174 mg/dL (ref <200)
HDL: 59 mg/dL (ref >=50)
LDL Cholesterol (Calc): 91 mg/dL (calc)
Non-HDL Cholesterol (Calc): 115 mg/dL (calc) (ref <130)
Total CHOL/HDL Ratio: 2.9 (calc) (ref <5.0)
Triglycerides: 140 mg/dL (ref <150)

## 2020-04-24 LAB — HEMOGLOBIN A1C
Hgb A1c MFr Bld: 6.4 % of total Hgb — ABNORMAL HIGH (ref <5.7)
Mean Plasma Glucose: 137 (calc)
eAG (mmol/L): 7.6 (calc)

## 2020-04-24 LAB — VITAMIN D 25 HYDROXY (VIT D DEFICIENCY, FRACTURES): Vit D, 25-Hydroxy: 50 ng/mL (ref 30–100)

## 2020-04-24 LAB — TSH: TSH: 0.79 mIU/L (ref 0.40–4.50)

## 2020-04-30 DIAGNOSIS — Z124 Encounter for screening for malignant neoplasm of cervix: Secondary | ICD-10-CM | POA: Diagnosis not present

## 2020-04-30 DIAGNOSIS — Z6836 Body mass index (BMI) 36.0-36.9, adult: Secondary | ICD-10-CM | POA: Diagnosis not present

## 2020-04-30 DIAGNOSIS — Z1231 Encounter for screening mammogram for malignant neoplasm of breast: Secondary | ICD-10-CM | POA: Diagnosis not present

## 2020-04-30 LAB — HM PAP SMEAR: HM Pap smear: NEGATIVE

## 2020-04-30 LAB — HM MAMMOGRAPHY

## 2020-05-14 ENCOUNTER — Telehealth: Payer: Self-pay | Admitting: Adult Health

## 2020-05-14 NOTE — Telephone Encounter (Signed)
Left message for patient to schedule Annual Wellness Visit.  Please schedule with Nurse Health Advisor Shannon Crews, RN at [facility]  

## 2020-05-18 ENCOUNTER — Other Ambulatory Visit: Payer: Self-pay | Admitting: Adult Health

## 2020-05-18 ENCOUNTER — Telehealth: Payer: Self-pay | Admitting: Adult Health

## 2020-05-18 NOTE — Telephone Encounter (Signed)
Left message for patient to schedule Annual Wellness Visit.  Please schedule with Nurse Health Advisor Shannon Crews, RN at [facility]  

## 2020-05-20 ENCOUNTER — Other Ambulatory Visit: Payer: Self-pay

## 2020-05-20 ENCOUNTER — Ambulatory Visit (INDEPENDENT_AMBULATORY_CARE_PROVIDER_SITE_OTHER): Payer: Medicare Other

## 2020-05-20 DIAGNOSIS — Z0000 Encounter for general adult medical examination without abnormal findings: Secondary | ICD-10-CM

## 2020-05-20 NOTE — Patient Instructions (Signed)
[name] , Thank you for taking time to come for your Medicare Wellness Visit. I appreciate your ongoing commitment to your health goals. Please review the following plan we discussed and let me know if I can assist you in the future.   Screening recommendations/referrals: Colonoscopy: Up to date, next due 01/10/2025 Mammogram: Up to date, please have OB GYN fax over records Bone Density: Up to date, please have OB/GYN fax over records  Recommended yearly ophthalmology/optometry visit for glaucoma screening and checkup Recommended yearly dental visit for hygiene and checkup  Vaccinations: Influenza vaccine: Up to date, next due this fall 2021 Pneumococcal vaccine: Completed series Tdap vaccine: Up to date, next due 10/12/2022 Shingles vaccine: completed series    Advanced directives: copies on file  Conditions/risks identified: None   Next appointment: 05/27/2020 @ 10:00 am   Preventive Care 74 Years and Older, Female Preventive care refers to lifestyle choices and visits with your health care provider that can promote health and wellness. What does preventive care include?  A yearly physical exam. This is also called an annual well check.  Dental exams once or twice a year.  Routine eye exams. Ask your health care provider how often you should have your eyes checked.  Personal lifestyle choices, including:  Daily care of your teeth and gums.  Regular physical activity.  Eating a healthy diet.  Avoiding tobacco and drug use.  Limiting alcohol use.  Practicing safe sex.  Taking low-dose aspirin every day.  Taking vitamin and mineral supplements as recommended by your health care provider. What happens during an annual well check? The services and screenings done by your health care provider during your annual well check will depend on your age, overall health, lifestyle risk factors, and family history of disease. Counseling  Your health care provider may ask you  questions about your:  Alcohol use.  Tobacco use.  Drug use.  Emotional well-being.  Home and relationship well-being.  Sexual activity.  Eating habits.  History of falls.  Memory and ability to understand (cognition).  Work and work Statistician.  Reproductive health. Screening  You may have the following tests or measurements:  Height, weight, and BMI.  Blood pressure.  Lipid and cholesterol levels. These may be checked every 5 years, or more frequently if you are over 58 years old.  Skin check.  Lung cancer screening. You may have this screening every year starting at age 35 if you have a 30-pack-year history of smoking and currently smoke or have quit within the past 15 years.  Fecal occult blood test (FOBT) of the stool. You may have this test every year starting at age 33.  Flexible sigmoidoscopy or colonoscopy. You may have a sigmoidoscopy every 5 years or a colonoscopy every 10 years starting at age 26.  Hepatitis C blood test.  Hepatitis B blood test.  Sexually transmitted disease (STD) testing.  Diabetes screening. This is done by checking your blood sugar (glucose) after you have not eaten for a while (fasting). You may have this done every 1-3 years.  Bone density scan. This is done to screen for osteoporosis. You may have this done starting at age 26.  Mammogram. This may be done every 1-2 years. Talk to your health care provider about how often you should have regular mammograms. Talk with your health care provider about your test results, treatment options, and if necessary, the need for more tests. Vaccines  Your health care provider may recommend certain vaccines, such as:  Influenza vaccine. This is recommended every year.  Tetanus, diphtheria, and acellular pertussis (Tdap, Td) vaccine. You may need a Td booster every 10 years.  Zoster vaccine. You may need this after age 12.  Pneumococcal 13-valent conjugate (PCV13) vaccine. One dose is  recommended after age 51.  Pneumococcal polysaccharide (PPSV23) vaccine. One dose is recommended after age 51. Talk to your health care provider about which screenings and vaccines you need and how often you need them. This information is not intended to replace advice given to you by your health care provider. Make sure you discuss any questions you have with your health care provider. Document Released: 10/09/2015 Document Revised: 06/01/2016 Document Reviewed: 07/14/2015 Elsevier Interactive Patient Education  2017 [place] Prevention in the Home Falls can cause injuries. They can happen to people of all ages. There are many things you can do to make your home safe and to help prevent falls. What can I do on the outside of my home?  Regularly fix the edges of walkways and driveways and fix any cracks.  Remove anything that might make you trip as you walk through a door, such as a raised step or threshold.  Trim any bushes or trees on the path to your home.  Use bright outdoor lighting.  Clear any walking paths of anything that might make someone trip, such as rocks or tools.  Regularly check to see if handrails are loose or broken. Make sure that both sides of any steps have handrails.  Any raised decks and porches should have guardrails on the edges.  Have any leaves, snow, or ice cleared regularly.  Use sand or salt on walking paths during winter.  Clean up any spills in your garage right away. This includes oil or grease spills. What can I do in the bathroom?  Use night lights.  Install grab bars by the toilet and in the tub and shower. Do not use towel bars as grab bars.  Use non-skid mats or decals in the tub or shower.  If you need to sit down in the shower, use a plastic, non-slip stool.  Keep the floor dry. Clean up any water that spills on the floor as soon as it happens.  Remove soap buildup in the tub or shower regularly.  Attach bath mats  securely with double-sided non-slip rug tape.  Do not have throw rugs and other things on the floor that can make you trip. What can I do in the bedroom?  Use night lights.  Make sure that you have a light by your bed that is easy to reach.  Do not use any sheets or blankets that are too big for your bed. They should not hang down onto the floor.  Have a firm chair that has side arms. You can use this for support while you get dressed.  Do not have throw rugs and other things on the floor that can make you trip. What can I do in the kitchen?  Clean up any spills right away.  Avoid walking on wet floors.  Keep items that you use a lot in easy-to-reach places.  If you need to reach something above you, use a strong step stool that has a grab bar.  Keep electrical cords out of the way.  Do not use floor polish or wax that makes floors slippery. If you must use wax, use non-skid floor wax.  Do not have throw rugs and other things on the floor that  can make you trip. What can I do with my stairs?  Do not leave any items on the stairs.  Make sure that there are handrails on both sides of the stairs and use them. Fix handrails that are broken or loose. Make sure that handrails are as long as the stairways.  Check any carpeting to make sure that it is firmly attached to the stairs. Fix any carpet that is loose or worn.  Avoid having throw rugs at the top or bottom of the stairs. If you do have throw rugs, attach them to the floor with carpet tape.  Make sure that you have a light switch at the top of the stairs and the bottom of the stairs. If you do not have them, ask someone to add them for you. What else can I do to help prevent falls?  Wear shoes that:  Do not have high heels.  Have rubber bottoms.  Are comfortable and fit you well.  Are closed at the toe. Do not wear sandals.  If you use a stepladder:  Make sure that it is fully opened. Do not climb a closed  stepladder.  Make sure that both sides of the stepladder are locked into place.  Ask someone to hold it for you, if possible.  Clearly mark and make sure that you can see:  Any grab bars or handrails.  First and last steps.  Where the edge of each step is.  Use tools that help you move around (mobility aids) if they are needed. These include:  Canes.  Walkers.  Scooters.  Crutches.  Turn on the lights when you go into a dark area. Replace any light bulbs as soon as they burn out.  Set up your furniture so you have a clear path. Avoid moving your furniture around.  If any of your floors are uneven, fix them.  If there are any pets around you, be aware of where they are.  Review your medicines with your doctor. Some medicines can make you feel dizzy. This can increase your chance of falling. Ask your doctor what other things that you can do to help prevent falls. This information is not intended to replace advice given to you by your health care provider. Make sure you discuss any questions you have with your health care provider. Document Released: 07/09/2009 Document Revised: 02/18/2016 Document Reviewed: 10/17/2014 Elsevier Interactive Patient Education  2017 [employer].

## 2020-05-20 NOTE — Progress Notes (Signed)
Subjective:   Caroline Suarez is a 74 y.o. female who presents for Medicare Annual (Subsequent) preventive examination.  I connected with Huel Coventry today by telephone and verified that I am speaking with the correct person using two identifiers. Location patient: home Location provider: work Persons participating in the virtual visit: patient, provider.   I discussed the limitations, risks, security and privacy concerns of performing an evaluation and management service by telephone and the availability of in person appointments. I also discussed with the patient that there may be a patient responsible charge related to this service. The patient expressed understanding and verbally consented to this telephonic visit.    Interactive audio and video telecommunications were attempted between this provider and patient, however failed, due to patient having technical difficulties OR patient did not have access to video capability.  We continued and completed visit with audio only.      Review of Systems    N/A Cardiac Risk Factors include: diabetes mellitus;hypertension;dyslipidemia;advanced age (>66men, >27 women)     Objective:    Today's Vitals   There is no height or weight on file to calculate BMI.  Advanced Directives 05/20/2020 04/03/2018  Does Patient Have a Medical Advance Directive? Yes Yes  Type of Paramedic of [place];Living will -  Does patient want to make changes to medical advance directive? No - Patient declined -  Copy of [place] in Chart? Yes - validated most recent copy scanned in chart (See row information) -    Current Medications (verified) Outpatient Encounter Medications as of 05/20/2020  Medication Sig  . alendronate (FOSAMAX) 70 MG tablet Take 1 tablet (70 mg total) by mouth once a week. Take with a full glass of water on an empty stomach.  Marland Kitchen aspirin EC 81 MG tablet Take 81 mg by mouth daily.  Marland Kitchen atorvastatin  (LIPITOR) 10 MG tablet TAKE 2 TABLETS BY MOUTH DAILY  . Calcium-Vitamin D-Vitamin K (VIACTIV CALCIUM PLUS D) 650-12.5-40 MG-MCG-MCG CHEW   . fexofenadine (ALLEGRA) 180 MG tablet Take 180 mg by mouth daily.  Marland Kitchen levothyroxine (SYNTHROID) 88 MCG tablet TAKE 1 TABLET BY MOUTH DAILY  . lisinopril (ZESTRIL) 30 MG tablet Take 1 tablet (30 mg total) by mouth daily.  . metFORMIN (GLUCOPHAGE) 1000 MG tablet TAKE 1 TABLET(1000 MG) BY MOUTH DAILY WITH BREAKFAST  . valACYclovir (VALTREX) 1000 MG tablet TK 2 TS PO BID  . albuterol (PROAIR HFA) 108 (90 Base) MCG/ACT inhaler Inhale 1-2 puffs every 6 (six) hours as needed into the lungs. (Patient not taking: Reported on 05/20/2020)   No facility-administered encounter medications on file as of 05/20/2020.    Allergies (verified) Codeine   History: Past Medical History:  Diagnosis Date  . Arthritis   . Asthma   . Chicken pox   . Heart murmur   . Hyperlipidemia   . Hypertension   . Hypothyroidism   . Osteoporosis   . Positive TB test    Past Surgical History:  Procedure Laterality Date  . BREAST BIOPSY     multiple. None came back positive   . FOOT SURGERY Left 2002   Family History  Problem Relation Age of Onset  . Heart disease Mother   . Stroke Mother   . Hyperlipidemia Mother   . Pulmonary fibrosis Father    Social History   Socioeconomic History  . Marital status: Widowed    Spouse name: Not on file  . Number of children: Not on file  .  Years of education: Not on file  . Highest education level: Not on file  Occupational History  . Not on file  Tobacco Use  . Smoking status: Never Smoker  . Smokeless tobacco: Never Used  Vaping Use  . Vaping Use: Never used  Substance and Sexual Activity  . Alcohol use: No  . Drug use: No  . Sexual activity: Not on file  Other Topics Concern  . Not on file  Social History Narrative   Retired Therapist, sports    Social Determinants of Radio broadcast assistant Strain: [place]   . Difficulty of  Paying Living Expenses: Not hard at all  Food Insecurity: No Food Insecurity  . Worried About Charity fundraiser in the Last Year: Never true  . Ran Out of Food in the Last Year: Never true  Transportation Needs: No Transportation Needs  . Lack of Transportation (Medical): No  . Lack of Transportation (Non-Medical): No  Physical Activity: Sufficiently Active  . Days of Exercise per Week: 5 days  . Minutes of Exercise per Session: 60 min  Stress: No Stress Concern Present  . Feeling of Stress : Not at all  Social Connections: Moderately Isolated  . Frequency of Communication with Friends and Family: More than three times a week  . Frequency of Social Gatherings with Friends and Family: More than three times a week  . Attends Religious Services: Never  . Active Member of Clubs or Organizations: Yes  . Attends Archivist Meetings: More than 4 times per year  . Marital Status: Widowed    Tobacco Counseling Counseling given: Not Answered   Clinical Intake:  Pre-visit preparation completed: Yes  Pain : No/denies pain     Nutritional Risks: None Diabetes: Yes (Patient states checked blood sugars every morning) CBG done?: No Did pt. bring in CBG monitor from home?: No  How often do you need to have someone help you when you read instructions, pamphlets, or other written materials from your doctor or pharmacy?: 1 - Never What is the last grade level you completed in school?: College  Diabetic?Yes  Interpreter Needed?: No  Information entered by :: [place] of Daily Living In your present state of health, do you have any difficulty performing the following activities: 05/20/2020  Hearing? N  Vision? Y  Comment Has cataracts  Difficulty concentrating or making decisions? N  Walking or climbing stairs? N  Dressing or bathing? N  Doing errands, shopping? N  Preparing Food and eating ? N  Using the Toilet? N  In the past six months, have you  accidently leaked urine? Y  Comment Has frequent bladder leakage  Do you have problems with loss of bowel control? N  Managing your Medications? N  Managing your Finances? N  Housekeeping or managing your Housekeeping? N  Some recent data might be hidden    Patient Care Team: Dorothyann Peng, NP as PCP - General (Family Medicine)  Indicate any recent Medical Services you may have received from other than [facility] providers in the past year (date may be approximate).     Assessment:   This is a routine wellness examination for Caroline Suarez.  Hearing/Vision screen  Hearing Screening   125Hz  250Hz  500Hz  1000Hz  2000Hz  3000Hz  4000Hz  6000Hz  8000Hz   Right ear:           Left ear:           Vision Screening Comments: Patient states gets eyes checked yearly  Dietary issues and exercise activities discussed: Current Exercise Habits: Structured exercise class, Type of exercise: Other - see comments (Water aerobics), Time (Minutes): 60, Frequency (Times/Week): 5, Weekly Exercise (Minutes/Week): 300, Intensity: Moderate, Exercise limited by: None identified  Goals    . Exercise 150 min/wk Moderate Activity     Join water aerobics class !     . Patient Stated     I will continue to do water aerobics for 1 hour 5 days a week      Depression Screen PHQ 2/9 Scores 05/20/2020 04/23/2020 04/23/2019 04/03/2018 04/03/2018  PHQ - 2 Score 0 0 0 0 0  PHQ- 9 Score 0 - - - -    Fall Risk Fall Risk  05/20/2020 04/23/2020 04/23/2019 04/03/2018 04/03/2018  Falls in the past year? 0 0 0 No No  Comment - - - almost a year ago; she tripped  -  Number falls in past yr: 0 - - - -  Injury with Fall? 0 - - - -  Risk for fall due to : Medication side effect - - - -  Follow up Falls evaluation completed;Falls prevention discussed - - - -    Any stairs in or around the home? No  If so, are there any without handrails? No  Home free of loose throw rugs in walkways, pet beds, electrical cords, etc? Yes  Adequate lighting in  your home to reduce risk of falls? Yes   ASSISTIVE DEVICES UTILIZED TO PREVENT FALLS:  Life alert? Yes  Use of a cane, walker or w/c? No  Grab bars in the bathroom? Yes  Shower chair or bench in shower? Yes  Elevated toilet seat or a handicapped toilet? No    Cognitive Function: MMSE - Mini Mental State Exam 04/03/2018  Not completed: (No Data)     6CIT Screen 05/20/2020  What Year? 0 points  What month? 0 points  What time? 0 points  Count back from 20 0 points  Months in reverse 0 points  Repeat phrase 0 points  Total Score 0    Immunizations Immunization History  Administered Date(s) Administered  . Fluad Quad(high Dose 65+) 05/20/2019  . Hepatitis B 03/23/1984  . Influenza, High Dose Seasonal PF 06/14/2017, 07/04/2018, 07/04/2018  . Influenza-Unspecified 06/25/2016  . PFIZER SARS-COV-2 Vaccination 11/09/2019, 12/02/2019  . Pneumococcal Conjugate-13 10/16/2013  . Pneumococcal Polysaccharide-23 06/28/2009, 04/03/2018  . Td 10/12/2012  . Zoster Recombinat (Shingrix) 02/09/2017, 04/12/2017    TDAP status: Up to date Flu Vaccine status: Up to date Pneumococcal vaccine status: Up to date Covid-19 vaccine status: Completed vaccines  Qualifies for Shingles Vaccine? Yes   Zostavax completed No   Shingrix Completed?: Yes  Screening Tests Health Maintenance  Topic Date Due  . MAMMOGRAM  01/25/2019  . INFLUENZA VACCINE  04/26/2020  . HEMOGLOBIN A1C  10/24/2020  . OPHTHALMOLOGY EXAM  12/25/2020  . FOOT EXAM  04/23/2021  . TETANUS/TDAP  10/12/2022  . COLONOSCOPY  01/11/2025  . DEXA SCAN  Completed  . COVID-19 Vaccine  Completed  . Hepatitis C Screening  Completed  . PNA vac Low Risk Adult  Completed    Health Maintenance  Health Maintenance Due  Topic Date Due  . MAMMOGRAM  01/25/2019  . INFLUENZA VACCINE  04/26/2020    Colorectal cancer screening: Completed 01/12/2015. Repeat every 10 years Mammogram status: Ordered 05/20/2020. Pt provided with contact  info and advised to call to schedule appt.  Bone Density status: Ordered 05/20/2020. Pt provided with contact info  and advised to call to schedule appt.  Lung Cancer Screening: (Low Dose CT Chest recommended if Age 38-80 years, 30 pack-year currently smoking OR have quit w/in 15years.) does not qualify.   Lung Cancer Screening Referral: N/A  Additional Screening:  Hepatitis C Screening: does qualify; Completed 04/03/2018  Vision Screening: Recommended annual ophthalmology exams for early detection of glaucoma and other disorders of the eye. Is the patient up to date with their annual eye exam?  Yes  Who is the provider or what is the name of the office in which the patient attends annual eye exams? Dr. Ellie Lunch  If pt is not established with a provider, would they like to be referred to a provider to establish care? No .   Dental Screening: Recommended annual dental exams for proper oral hygiene  Community Resource Referral / Chronic Care Management: CRR required this visit?  No   CCM required this visit?  No      Plan:     I have personally reviewed and noted the following in the patient's chart:   . Medical and social history . Use of alcohol, tobacco or illicit drugs  . Current medications and supplements . Functional ability and status . Nutritional status . Physical activity . Advanced directives . List of other physicians . Hospitalizations, surgeries, and ER visits in previous 12 months . Vitals . Screenings to include cognitive, depression, and falls . Referrals and appointments  In addition, I have reviewed and discussed with patient certain preventive protocols, quality metrics, and best practice recommendations. A written personalized care plan for preventive services as well as general preventive health recommendations were provided to patient.     Ofilia Neas, LPN   [id number]   Nurse Notes: None

## 2020-05-27 ENCOUNTER — Other Ambulatory Visit: Payer: Medicare Other

## 2020-05-27 ENCOUNTER — Other Ambulatory Visit: Payer: Self-pay

## 2020-05-27 DIAGNOSIS — N289 Disorder of kidney and ureter, unspecified: Secondary | ICD-10-CM

## 2020-05-28 LAB — BASIC METABOLIC PANEL WITH GFR
BUN/Creatinine Ratio: 46 (calc) — ABNORMAL HIGH (ref 6–22)
BUN: 42 mg/dL — ABNORMAL HIGH (ref 7–25)
CO2: 23 mmol/L (ref 20–32)
Calcium: 9.8 mg/dL (ref 8.6–10.4)
Chloride: 103 mmol/L (ref 98–110)
Creat: 0.92 mg/dL (ref 0.60–0.93)
GFR, Est African American: 71 mL/min/{1.73_m2} (ref >=60)
GFR, Est Non African American: 61 mL/min/{1.73_m2} (ref >=60)
Glucose, Bld: 122 mg/dL — ABNORMAL HIGH (ref 65–99)
Potassium: 4.2 mmol/L (ref 3.5–5.3)
Sodium: 136 mmol/L (ref 135–146)

## 2020-06-02 ENCOUNTER — Encounter: Payer: Self-pay | Admitting: Adult Health

## 2020-06-07 ENCOUNTER — Other Ambulatory Visit: Payer: Self-pay | Admitting: Adult Health

## 2020-07-17 ENCOUNTER — Other Ambulatory Visit: Payer: Self-pay | Admitting: Adult Health

## 2020-07-17 DIAGNOSIS — I10 Essential (primary) hypertension: Secondary | ICD-10-CM

## 2020-07-24 ENCOUNTER — Ambulatory Visit (INDEPENDENT_AMBULATORY_CARE_PROVIDER_SITE_OTHER): Payer: Medicare Other | Admitting: Adult Health

## 2020-07-24 ENCOUNTER — Other Ambulatory Visit: Payer: Self-pay

## 2020-07-24 VITALS — BP 130/72 | HR 86 | Temp 98.2°F | Ht 61.81 in | Wt 194.8 lb

## 2020-07-24 DIAGNOSIS — E119 Type 2 diabetes mellitus without complications: Secondary | ICD-10-CM

## 2020-07-24 DIAGNOSIS — Z23 Encounter for immunization: Secondary | ICD-10-CM

## 2020-07-24 DIAGNOSIS — N289 Disorder of kidney and ureter, unspecified: Secondary | ICD-10-CM

## 2020-07-24 MED ORDER — METFORMIN HCL 1000 MG PO TABS: 1000.0000 mg | ORAL_TABLET | Freq: Two times a day (BID) | ORAL | 1 refills | Status: DC

## 2020-07-24 NOTE — Progress Notes (Signed)
Subjective:    Patient ID: Caroline Suarez, female    DOB: 06/12/1946, 74 y.o.   MRN: [id number]  HPI 74 year old female who  has a past medical history of Arthritis, Asthma, Chicken pox, Heart murmur, Hyperlipidemia, Hypertension, Hypothyroidism, Osteoporosis, and Positive TB test.  She presents to the office today for three month follow up regarding diabetes   She is currently maintained on Metformin 1000 mg twice daily.  She has been monitoring her blood sugars in the morning and reports readings usually in the 150s to 170s fasting.  She has not been checking her blood sugars at night often, when she does she has had some readings in the 200s.  She continues to exercise on a routine basis and eat a heart healthy diet.  She would also like to recheck her kidney function is during her physical her BUN creatinine and GFR were slightly decreased.  We did retest her and her levels came back to baseline.  Lab Results  Component Value Date   HGBA1C 6.4 (H) 04/23/2020   Wt Readings from Last 3 Encounters:  07/24/20 194 lb 12.8 oz (88.4 kg)  04/23/20 199 lb (90.3 kg)  12/24/19 192 lb (87.1 kg)     Review of Systems See HPI   Past Medical History:  Diagnosis Date  . Arthritis   . Asthma   . Chicken pox   . Heart murmur   . Hyperlipidemia   . Hypertension   . Hypothyroidism   . Osteoporosis   . Positive TB test     Social History   Socioeconomic History  . Marital status: Widowed    Spouse name: Not on file  . Number of children: Not on file  . Years of education: Not on file  . Highest education level: Not on file  Occupational History  . Not on file  Tobacco Use  . Smoking status: Never Smoker  . Smokeless tobacco: Never Used  Vaping Use  . Vaping Use: Never used  Substance and Sexual Activity  . Alcohol use: No  . Drug use: No  . Sexual activity: Not on file  Other Topics Concern  . Not on file  Social History Narrative   Retired Therapist, sports    Social Determinants of  Radio broadcast assistant Strain: [place]   . Difficulty of Paying Living Expenses: Not hard at all  Food Insecurity: No Food Insecurity  . Worried About Charity fundraiser in the Last Year: Never true  . Ran Out of Food in the Last Year: Never true  Transportation Needs: No Transportation Needs  . Lack of Transportation (Medical): No  . Lack of Transportation (Non-Medical): No  Physical Activity: Sufficiently Active  . Days of Exercise per Week: 5 days  . Minutes of Exercise per Session: 60 min  Stress: No Stress Concern Present  . Feeling of Stress : Not at all  Social Connections: Moderately Isolated  . Frequency of Communication with Friends and Family: More than three times a week  . Frequency of Social Gatherings with Friends and Family: More than three times a week  . Attends Religious Services: Never  . Active Member of Clubs or Organizations: Yes  . Attends Archivist Meetings: More than 4 times per year  . Marital Status: Widowed  Intimate Partner Violence: Not At Risk  . Fear of Current or Ex-Partner: No  . Emotionally Abused: No  . Physically Abused: No  . Sexually Abused: No  Past Surgical History:  Procedure Laterality Date  . BREAST BIOPSY     multiple. None came back positive   . FOOT SURGERY Left 2002    Family History  Problem Relation Age of Onset  . Heart disease Mother   . Stroke Mother   . Hyperlipidemia Mother   . Pulmonary fibrosis Father     Allergies  Allergen Reactions  . Codeine Nausea And Vomiting    Current Outpatient Medications on File Prior to Visit  Medication Sig Dispense Refill  . albuterol (PROAIR HFA) 108 (90 Base) MCG/ACT inhaler Inhale 1-2 puffs every 6 (six) hours as needed into the lungs. 6.7 g 3  . alendronate (FOSAMAX) 70 MG tablet TAKE 1 TABLET(70 MG) BY MOUTH 1 TIME A WEEK WITH A FULL GLASS OF WATER AND ON AN EMPTY STOMACH 12 tablet 0  . aspirin EC 81 MG tablet Take 81 mg by mouth daily.    Marland Kitchen  atorvastatin (LIPITOR) 10 MG tablet TAKE 2 TABLETS BY MOUTH DAILY 180 tablet 1  . Calcium-Vitamin D-Vitamin K (VIACTIV CALCIUM PLUS D) 650-12.5-40 MG-MCG-MCG CHEW     . Cetirizine HCl (ZYRTEC ALLERGY PO)     . levothyroxine (SYNTHROID) 88 MCG tablet TAKE 1 TABLET BY MOUTH DAILY 90 tablet 1  . lisinopril (ZESTRIL) 30 MG tablet TAKE 1 TABLET(30 MG) BY MOUTH DAILY 90 tablet 3  . valACYclovir (VALTREX) 1000 MG tablet TK 2 TS PO BID 90 tablet 3   No current facility-administered medications on file prior to visit.    BP 130/72   Pulse 86   Temp 98.2 F (36.8 C) (Oral)   Ht 5' 1.81" (1.57 m)   Wt 194 lb 12.8 oz (88.4 kg)   SpO2 96%   BMI 35.85 kg/m       Objective:   Physical Exam Vitals and nursing note reviewed.  Constitutional:      Appearance: Normal appearance.  Cardiovascular:     Heart sounds: Normal heart sounds.  Musculoskeletal:        General: Normal range of motion.  Skin:    General: Skin is warm and dry.  Neurological:     General: No focal deficit present.     Mental Status: She is alert and oriented to person, place, and time.  Psychiatric:        Mood and Affect: Mood normal.        Behavior: Behavior normal.        Thought Content: Thought content normal.        Judgment: Judgment normal.       Assessment & Plan:  1. Type 2 diabetes mellitus without complication, without long-term current use of insulin (HCC) - Will start with increasing Metformin to BID dosing.  - Hemoglobin A1c; Future - metFORMIN (GLUCOPHAGE) 1000 MG tablet; Take 1 tablet (1,000 mg total) by mouth 2 (two) times daily with a meal.  Dispense: 180 tablet; Refill: 1 - BASIC METABOLIC PANEL WITH GFR; Future - Microalbumin/Creatinine Ratio, Urine; Future - POC Urinalysis Dipstick; Future  2. Function kidney decreased  - metFORMIN (GLUCOPHAGE) 1000 MG tablet; Take 1 tablet (1,000 mg total) by mouth 2 (two) times daily with a meal.  Dispense: 180 tablet; Refill: 1 - BASIC METABOLIC PANEL  WITH GFR; Future - Microalbumin/Creatinine Ratio, Urine; Future - POC Urinalysis Dipstick; Future  3. Need for influenza vaccination  - Flu Vaccine QUAD High Dose(Fluad)  Dorothyann Peng, NP

## 2020-07-25 LAB — BASIC METABOLIC PANEL WITH GFR
BUN/Creatinine Ratio: 32 (calc) — ABNORMAL HIGH (ref 6–22)
BUN: 38 mg/dL — ABNORMAL HIGH (ref 7–25)
CO2: 25 mmol/L (ref 20–32)
Calcium: 9.7 mg/dL (ref 8.6–10.4)
Chloride: 103 mmol/L (ref 98–110)
Creat: 1.19 mg/dL — ABNORMAL HIGH (ref 0.60–0.93)
GFR, Est African American: 52 mL/min/{1.73_m2} — ABNORMAL LOW (ref >=60)
GFR, Est Non African American: 45 mL/min/{1.73_m2} — ABNORMAL LOW (ref >=60)
Glucose, Bld: 141 mg/dL — ABNORMAL HIGH (ref 65–99)
Potassium: 4.3 mmol/L (ref 3.5–5.3)
Sodium: 140 mmol/L (ref 135–146)

## 2020-07-25 LAB — HEMOGLOBIN A1C
Hgb A1c MFr Bld: 6.2 % of total Hgb — ABNORMAL HIGH (ref <5.7)
Mean Plasma Glucose: 131 (calc)
eAG (mmol/L): 7.3 (calc)

## 2020-07-25 LAB — MICROALBUMIN / CREATININE URINE RATIO
Creatinine, Urine: 97 mg/dL (ref 20–275)
Microalb Creat Ratio: 20 mcg/mg creat (ref <30)
Microalb, Ur: 1.9 mg/dL

## 2020-07-28 ENCOUNTER — Other Ambulatory Visit: Payer: Self-pay | Admitting: Adult Health

## 2020-07-28 ENCOUNTER — Telehealth: Payer: Self-pay | Admitting: Adult Health

## 2020-07-28 DIAGNOSIS — N289 Disorder of kidney and ureter, unspecified: Secondary | ICD-10-CM

## 2020-07-28 MED ORDER — GLIPIZIDE ER 5 MG PO TB24: 5.0000 mg | ORAL_TABLET | Freq: Two times a day (BID) | ORAL | 1 refills | Status: DC

## 2020-07-28 NOTE — Telephone Encounter (Signed)
Spoke to patient informed her of her labs.  Her A1c was 6.2.  Kidney function had slightly decreased although she was hydrated.  She is not using any anti-inflammatory medications at home.  Trial taking her off Metformin and placing her on glipizide extended release daily she will follow-up for repeat BMP in 1 month

## 2020-07-31 DIAGNOSIS — H52203 Unspecified astigmatism, bilateral: Secondary | ICD-10-CM | POA: Diagnosis not present

## 2020-07-31 DIAGNOSIS — H2513 Age-related nuclear cataract, bilateral: Secondary | ICD-10-CM | POA: Diagnosis not present

## 2020-07-31 DIAGNOSIS — H35372 Puckering of macula, left eye: Secondary | ICD-10-CM | POA: Diagnosis not present

## 2020-07-31 DIAGNOSIS — E119 Type 2 diabetes mellitus without complications: Secondary | ICD-10-CM | POA: Diagnosis not present

## 2020-07-31 LAB — HM DIABETES EYE EXAM

## 2020-08-04 ENCOUNTER — Encounter: Payer: Self-pay | Admitting: Adult Health

## 2020-08-13 DIAGNOSIS — H25811 Combined forms of age-related cataract, right eye: Secondary | ICD-10-CM | POA: Diagnosis not present

## 2020-08-13 DIAGNOSIS — H2511 Age-related nuclear cataract, right eye: Secondary | ICD-10-CM | POA: Diagnosis not present

## 2020-08-27 DIAGNOSIS — H2512 Age-related nuclear cataract, left eye: Secondary | ICD-10-CM | POA: Diagnosis not present

## 2020-08-27 DIAGNOSIS — H25812 Combined forms of age-related cataract, left eye: Secondary | ICD-10-CM | POA: Diagnosis not present

## 2020-08-31 ENCOUNTER — Other Ambulatory Visit (INDEPENDENT_AMBULATORY_CARE_PROVIDER_SITE_OTHER): Payer: Medicare Other

## 2020-08-31 ENCOUNTER — Other Ambulatory Visit: Payer: Self-pay

## 2020-08-31 DIAGNOSIS — N289 Disorder of kidney and ureter, unspecified: Secondary | ICD-10-CM | POA: Diagnosis not present

## 2020-09-01 LAB — BASIC METABOLIC PANEL WITH GFR
BUN: 25 mg/dL (ref 7–25)
CO2: 25 mmol/L (ref 20–32)
Calcium: 9.5 mg/dL (ref 8.6–10.4)
Chloride: 106 mmol/L (ref 98–110)
Creat: 0.89 mg/dL (ref 0.60–0.93)
GFR, Est African American: 74 mL/min/{1.73_m2} (ref >=60)
GFR, Est Non African American: 64 mL/min/{1.73_m2} (ref >=60)
Glucose, Bld: 129 mg/dL — ABNORMAL HIGH (ref 65–99)
Potassium: 4.3 mmol/L (ref 3.5–5.3)
Sodium: 139 mmol/L (ref 135–146)

## 2020-09-01 NOTE — Progress Notes (Signed)
Kidney Function is normal

## 2020-09-02 ENCOUNTER — Other Ambulatory Visit: Payer: Self-pay | Admitting: *Deleted

## 2020-09-02 MED ORDER — ALENDRONATE SODIUM 70 MG PO TABS
ORAL_TABLET | ORAL | 0 refills | Status: DC
Start: 2020-09-02 — End: 2020-09-12

## 2020-09-02 NOTE — Telephone Encounter (Signed)
Rx done. 

## 2020-09-12 ENCOUNTER — Other Ambulatory Visit: Payer: Self-pay | Admitting: Adult Health

## 2020-09-29 ENCOUNTER — Telehealth: Payer: Self-pay | Admitting: Adult Health

## 2020-09-29 NOTE — Telephone Encounter (Signed)
Patient is calling and requesting a refill for glipiZIDE (GLUCOTROL XL) 5 MG 24 hr tablet sent to [facility] #[id number] [address] [place], [place] [address]  Phone:  [phone] Fax:  [phone]

## 2020-09-30 MED ORDER — GLIPIZIDE ER 5 MG PO TB24: 5.0000 mg | ORAL_TABLET | Freq: Two times a day (BID) | ORAL | 0 refills | Status: DC

## 2020-09-30 NOTE — Telephone Encounter (Signed)
Sent to the pharmacy by e-scribe. 

## 2020-11-14 ENCOUNTER — Other Ambulatory Visit: Payer: Self-pay | Admitting: Adult Health

## 2020-11-17 NOTE — Telephone Encounter (Signed)
Sent to the pharmacy by e-scribe. 

## 2020-12-27 ENCOUNTER — Other Ambulatory Visit: Payer: Self-pay | Admitting: Adult Health

## 2021-01-21 ENCOUNTER — Other Ambulatory Visit: Payer: Self-pay

## 2021-01-21 ENCOUNTER — Encounter: Payer: Self-pay | Admitting: Adult Health

## 2021-01-21 ENCOUNTER — Ambulatory Visit (INDEPENDENT_AMBULATORY_CARE_PROVIDER_SITE_OTHER): Payer: Medicare Other | Admitting: Adult Health

## 2021-01-21 VITALS — BP 140/80 | HR 84 | Temp 97.8°F | Wt 218.0 lb

## 2021-01-21 DIAGNOSIS — I10 Essential (primary) hypertension: Secondary | ICD-10-CM | POA: Diagnosis not present

## 2021-01-21 DIAGNOSIS — E119 Type 2 diabetes mellitus without complications: Secondary | ICD-10-CM

## 2021-01-21 LAB — POCT GLYCOSYLATED HEMOGLOBIN (HGB A1C): Hemoglobin A1C: 6 % — AB (ref 4.0–5.6)

## 2021-01-21 NOTE — Progress Notes (Signed)
Subjective:    Patient ID: Caroline Suarez, female    DOB: 24-Sep-1946, 75 y.o.   MRN: [id number]  HPI  75 year old female who  has a past medical history of Arthritis, Asthma, Chicken pox, Heart murmur, Hyperlipidemia, Hypertension, Hypothyroidism, Osteoporosis, and Positive TB test.  She presents to the office today for 64-month follow-up regarding diabetes and hypertension  DM -Has been well controlled in the past. She has been able to come off Metformin completely and is currrently maintained on Glipizide 5 mg ER twice daily.   She has not  been trying to stay active as she has in the past. Her last A1c was 6.2 in October 2021  Lab Results  Component Value Date   HGBA1C 6.0 (A) 01/21/2021   Hypertension -well-controlled with lisinopril 30 mg daily BP Readings from Last 3 Encounters:  01/21/21 140/80  07/24/20 130/72  04/23/20 124/78    Review of Systems See HPI   Past Medical History:  Diagnosis Date  . Arthritis   . Asthma   . Chicken pox   . Heart murmur   . Hyperlipidemia   . Hypertension   . Hypothyroidism   . Osteoporosis   . Positive TB test     Social History   Socioeconomic History  . Marital status: Widowed    Spouse name: Not on file  . Number of children: Not on file  . Years of education: Not on file  . Highest education level: Not on file  Occupational History  . Not on file  Tobacco Use  . Smoking status: Never Smoker  . Smokeless tobacco: Never Used  Vaping Use  . Vaping Use: Never used  Substance and Sexual Activity  . Alcohol use: No  . Drug use: No  . Sexual activity: Not on file  Other Topics Concern  . Not on file  Social History Narrative   Retired Therapist, sports    Social Determinants of Radio broadcast assistant Strain: [place]   . Difficulty of Paying Living Expenses: Not hard at all  Food Insecurity: No Food Insecurity  . Worried About Charity fundraiser in the Last Year: Never true  . Ran Out of Food in the Last Year: Never true   Transportation Needs: No Transportation Needs  . Lack of Transportation (Medical): No  . Lack of Transportation (Non-Medical): No  Physical Activity: Sufficiently Active  . Days of Exercise per Week: 5 days  . Minutes of Exercise per Session: 60 min  Stress: No Stress Concern Present  . Feeling of Stress : Not at all  Social Connections: Moderately Isolated  . Frequency of Communication with Friends and Family: More than three times a week  . Frequency of Social Gatherings with Friends and Family: More than three times a week  . Attends Religious Services: Never  . Active Member of Clubs or Organizations: Yes  . Attends Archivist Meetings: More than 4 times per year  . Marital Status: Widowed  Intimate Partner Violence: Not At Risk  . Fear of Current or Ex-Partner: No  . Emotionally Abused: No  . Physically Abused: No  . Sexually Abused: No    Past Surgical History:  Procedure Laterality Date  . BREAST BIOPSY     multiple. None came back positive   . FOOT SURGERY Left 2002    Family History  Problem Relation Age of Onset  . Heart disease Mother   . Stroke Mother   . Hyperlipidemia  Mother   . Pulmonary fibrosis Father     Allergies  Allergen Reactions  . Codeine Nausea And Vomiting    Current Outpatient Medications on File Prior to Visit  Medication Sig Dispense Refill  . albuterol (PROAIR HFA) 108 (90 Base) MCG/ACT inhaler Inhale 1-2 puffs every 6 (six) hours as needed into the lungs. 6.7 g 3  . alendronate (FOSAMAX) 70 MG tablet TAKE 1 TABLET(70 MG) BY MOUTH 1 TIME A WEEK WITH A FULL GLASS OF WATER AND ON AN EMPTY STOMACH 12 tablet 1  . aspirin EC 81 MG tablet Take 81 mg by mouth daily.    Marland Kitchen atorvastatin (LIPITOR) 10 MG tablet TAKE 2 TABLETS BY MOUTH DAILY 180 tablet 1  . Calcium-Vitamin D-Vitamin K (VIACTIV CALCIUM PLUS D) 650-12.5-40 MG-MCG-MCG CHEW     . Cetirizine HCl (ZYRTEC ALLERGY PO)     . glipiZIDE (GLUCOTROL XL) 5 MG 24 hr tablet TAKE 1  TABLET(5 MG) BY MOUTH IN THE MORNING AND AT BEDTIME 180 tablet 0  . levothyroxine (SYNTHROID) 88 MCG tablet TAKE 1 TABLET BY MOUTH DAILY 90 tablet 1  . lisinopril (ZESTRIL) 30 MG tablet TAKE 1 TABLET(30 MG) BY MOUTH DAILY 90 tablet 3  . valACYclovir (VALTREX) 1000 MG tablet TK 2 TS PO BID 90 tablet 3   No current facility-administered medications on file prior to visit.    BP 140/80 (BP Location: Left Arm, Patient Position: Sitting, Cuff Size: Normal)   Pulse 84   Temp 97.8 F (36.6 C) (Oral)   Wt 218 lb (98.9 kg)   SpO2 97%   BMI 40.12 kg/m       Objective:   Physical Exam Vitals and nursing note reviewed.  Constitutional:      Appearance: Normal appearance.  Cardiovascular:     Rate and Rhythm: Normal rate and regular rhythm.     Pulses: Normal pulses.     Heart sounds: Normal heart sounds.  Pulmonary:     Effort: Pulmonary effort is normal.     Breath sounds: Normal breath sounds.  Musculoskeletal:        General: Normal range of motion.  Skin:    General: Skin is warm and dry.     Capillary Refill: Capillary refill takes less than 2 seconds.  Neurological:     General: No focal deficit present.     Mental Status: She is oriented to person, place, and time.  Psychiatric:        Mood and Affect: Mood normal.        Behavior: Behavior normal.        Thought Content: Thought content normal.        Judgment: Judgment normal.       Assessment & Plan:  1. Type 2 diabetes mellitus without complication, without long-term current use of insulin (HCC) - Continue with Glipizide 5 mg ER BID - POCT glycosylated hemoglobin (Hb A1C)- 6.0 - controlled.  - Follow up in July/August for CPE  2. Essential hypertension - Continue with current medication- controlled.   Dorothyann Peng, NP

## 2021-02-02 ENCOUNTER — Telehealth: Payer: Self-pay | Admitting: Adult Health

## 2021-02-02 NOTE — Progress Notes (Signed)
  Chronic Care Management   Outreach Note  02/02/2021 Name: Caroline Suarez MRN: [id number] DOB: 09/18/1946  Referred by: Dorothyann Peng, NP Reason for referral : No chief complaint on file.   An unsuccessful telephone outreach was attempted today. The patient was referred to the pharmacist for assistance with care management and care coordination.   Follow Up Plan:   [name] [employer] Scheduler

## 2021-03-24 ENCOUNTER — Telehealth: Payer: Self-pay | Admitting: Adult Health

## 2021-03-24 ENCOUNTER — Other Ambulatory Visit: Payer: Self-pay | Admitting: Adult Health

## 2021-03-24 NOTE — Chronic Care Management (AMB) (Signed)
  Chronic Care Management   Note  03/24/2021 Name: Caroline Suarez MRN: [id number] DOB: 09/08/1946  Caroline Suarez is a 75 y.o. year old female who is a primary care patient of Dorothyann Peng, NP. I reached out to Marylou Mccoy by phone today in response to a referral sent by [name]'s PCP, Dorothyann Peng, NP.   Caroline Suarez was given information about Chronic Care Management services today including:  CCM service includes personalized support from designated clinical staff supervised by her physician, including individualized plan of care and coordination with other care providers 24/7 contact phone numbers for assistance for urgent and routine care needs. Service will only be billed when office clinical staff spend 20 minutes or more in a month to coordinate care. Only one practitioner may furnish and bill the service in a calendar month. The patient may stop CCM services at any time (effective at the end of the month) by phone call to the office staff.   Patient agreed to services and verbal consent obtained.   Follow up plan:   Caroline Suarez

## 2021-03-24 NOTE — Chronic Care Management (AMB) (Signed)
  Chronic Care Management   Outreach Note  03/24/2021 Name: Caroline Suarez MRN: [id number] DOB: 1946-05-29  Referred by: Dorothyann Peng, NP Reason for referral : No chief complaint on file.   A second unsuccessful telephone outreach was attempted today. The patient was referred to pharmacist for assistance with care management and care coordination.  Follow Up Plan:   Tatjana Dellinger [employer] Scheduler

## 2021-04-01 DIAGNOSIS — L570 Actinic keratosis: Secondary | ICD-10-CM | POA: Diagnosis not present

## 2021-04-01 DIAGNOSIS — L814 Other melanin hyperpigmentation: Secondary | ICD-10-CM | POA: Diagnosis not present

## 2021-04-01 DIAGNOSIS — D2272 Melanocytic nevi of left lower limb, including hip: Secondary | ICD-10-CM | POA: Diagnosis not present

## 2021-04-01 DIAGNOSIS — D485 Neoplasm of uncertain behavior of skin: Secondary | ICD-10-CM | POA: Diagnosis not present

## 2021-04-01 DIAGNOSIS — L821 Other seborrheic keratosis: Secondary | ICD-10-CM | POA: Diagnosis not present

## 2021-04-01 DIAGNOSIS — Z85828 Personal history of other malignant neoplasm of skin: Secondary | ICD-10-CM | POA: Diagnosis not present

## 2021-04-01 DIAGNOSIS — D1801 Hemangioma of skin and subcutaneous tissue: Secondary | ICD-10-CM | POA: Diagnosis not present

## 2021-04-26 ENCOUNTER — Other Ambulatory Visit: Payer: Self-pay

## 2021-04-27 ENCOUNTER — Encounter: Payer: Self-pay | Admitting: Adult Health

## 2021-04-27 ENCOUNTER — Ambulatory Visit (INDEPENDENT_AMBULATORY_CARE_PROVIDER_SITE_OTHER): Payer: Medicare Other | Admitting: Adult Health

## 2021-04-27 VITALS — BP 110/80 | HR 104 | Temp 97.1°F | Ht 63.0 in | Wt 223.0 lb

## 2021-04-27 DIAGNOSIS — E039 Hypothyroidism, unspecified: Secondary | ICD-10-CM

## 2021-04-27 DIAGNOSIS — Z1211 Encounter for screening for malignant neoplasm of colon: Secondary | ICD-10-CM | POA: Diagnosis not present

## 2021-04-27 DIAGNOSIS — E119 Type 2 diabetes mellitus without complications: Secondary | ICD-10-CM | POA: Diagnosis not present

## 2021-04-27 DIAGNOSIS — M810 Age-related osteoporosis without current pathological fracture: Secondary | ICD-10-CM | POA: Diagnosis not present

## 2021-04-27 DIAGNOSIS — E782 Mixed hyperlipidemia: Secondary | ICD-10-CM | POA: Diagnosis not present

## 2021-04-27 DIAGNOSIS — F419 Anxiety disorder, unspecified: Secondary | ICD-10-CM | POA: Diagnosis not present

## 2021-04-27 DIAGNOSIS — I10 Essential (primary) hypertension: Secondary | ICD-10-CM | POA: Diagnosis not present

## 2021-04-27 LAB — LIPID PANEL
Cholesterol: 161 mg/dL (ref 0–200)
HDL: 50.3 mg/dL (ref >39.00)
LDL Cholesterol: 84 mg/dL (ref 0–99)
NonHDL: 110.31
Total CHOL/HDL Ratio: 3
Triglycerides: 134 mg/dL (ref 0.0–149.0)
VLDL: 26.8 mg/dL (ref 0.0–40.0)

## 2021-04-27 LAB — COMPREHENSIVE METABOLIC PANEL
ALT: 16 U/L (ref 0–35)
AST: 16 U/L (ref 0–37)
Albumin: 4.1 g/dL (ref 3.5–5.2)
Alkaline Phosphatase: 64 U/L (ref 39–117)
BUN: 35 mg/dL — ABNORMAL HIGH (ref 6–23)
CO2: 23 mEq/L (ref 19–32)
Calcium: 9.7 mg/dL (ref 8.4–10.5)
Chloride: 105 mEq/L (ref 96–112)
Creatinine, Ser: 1.08 mg/dL (ref 0.40–1.20)
GFR: 50.37 mL/min — ABNORMAL LOW (ref >60.00)
Glucose, Bld: 132 mg/dL — ABNORMAL HIGH (ref 70–99)
Potassium: 4.1 mEq/L (ref 3.5–5.1)
Sodium: 140 mEq/L (ref 135–145)
Total Bilirubin: 0.4 mg/dL (ref 0.2–1.2)
Total Protein: 7.3 g/dL (ref 6.0–8.3)

## 2021-04-27 LAB — CBC WITH DIFFERENTIAL/PLATELET
Basophils Absolute: 0.1 10*3/uL (ref 0.0–0.1)
Basophils Relative: 0.9 % (ref 0.0–3.0)
Eosinophils Absolute: 0.2 10*3/uL (ref 0.0–0.7)
Eosinophils Relative: 2.3 % (ref 0.0–5.0)
HCT: 36.4 % (ref 36.0–46.0)
Hemoglobin: 12.2 g/dL (ref 12.0–15.0)
Lymphocytes Relative: 26.6 % (ref 12.0–46.0)
Lymphs Abs: 1.9 10*3/uL (ref 0.7–4.0)
MCHC: 33.6 g/dL (ref 30.0–36.0)
MCV: 95.3 fl (ref 78.0–100.0)
Monocytes Absolute: 0.5 10*3/uL (ref 0.1–1.0)
Monocytes Relative: 7.5 % (ref 3.0–12.0)
Neutro Abs: 4.5 10*3/uL (ref 1.4–7.7)
Neutrophils Relative %: 62.7 % (ref 43.0–77.0)
Platelets: 307 10*3/uL (ref 150.0–400.0)
RBC: 3.83 Mil/uL — ABNORMAL LOW (ref 3.87–5.11)
RDW: 13.2 % (ref 11.5–15.5)
WBC: 7.2 10*3/uL (ref 4.0–10.5)

## 2021-04-27 LAB — TSH: TSH: 0.49 u[IU]/mL (ref 0.35–5.50)

## 2021-04-27 LAB — HEMOGLOBIN A1C: Hgb A1c MFr Bld: 6.4 % (ref 4.6–6.5)

## 2021-04-27 MED ORDER — FLUOXETINE HCL 10 MG PO TABS: 10.0000 mg | ORAL_TABLET | Freq: Every day | ORAL | 1 refills | Status: DC

## 2021-04-27 MED ORDER — VALACYCLOVIR HCL 1 G PO TABS: ORAL_TABLET | ORAL | 3 refills | Status: DC

## 2021-04-27 NOTE — Patient Instructions (Addendum)
It was great seeing you today   We will follow up with you regarding your blood work   We will start you on Prozac 10 mg - please schedule a virtual follow up in 30 days to see how you are doing

## 2021-04-27 NOTE — Progress Notes (Signed)
Subjective:    Patient ID: Caroline Suarez, female    DOB: 01/11/1946, 75 y.o.   MRN: [id number]  HPI Patient presents for yearly preventative medicine examination. She is a pleasant 75 year old female who  has a past medical history of Arthritis, Asthma, Chicken pox, Heart murmur, Hyperlipidemia, Hypertension, Hypothyroidism, Osteoporosis, and Positive TB test.  DM -maintained on glipizide 5 mg ER daily.  She does stay active and tries to eat a heart healthy diet. Lab Results  Component Value Date   HGBA1C 6.0 (A) 01/21/2021   Hyperlipidemia -prescribed Lipitor 10 mg daily.  She denies myalgia or fatigue Lab Results  Component Value Date   CHOL 174 04/23/2020   HDL 59 04/23/2020   LDLCALC 91 04/23/2020   TRIG 140 04/23/2020   CHOLHDL 2.9 04/23/2020   Hypothyroidism -controlled with Synthroid 88 mcg daily  Osteoporosis -prescribed Fosamax 70 mg weekly.  Hypertension -controlled with lisinopril 30 mg daily.  She denies dizziness, lightheadedness, chest pain, shortness of breath BP Readings from Last 3 Encounters:  04/27/21 110/80  01/21/21 140/80  07/24/20 130/72   Anxiety -this has been a longstanding issue for most of her life, reports that her anxiety has gotten worse over the last few years since her husband passed away.  She gets anxious and feels as though her heart rate increases when doing her activities of daily living such as going to the grocery store.  He does not want to leave the house much due to her anxiety.  Denies panic attacks or depression.  Feels as though that is time to ask for help and possibly start medication  All immunizations and health maintenance protocols were reviewed with the patient and needed orders were placed.  Appropriate screening laboratory values were ordered for the patient including screening of hyperlipidemia, renal function and hepatic function. If indicated by BPH, a PSA was ordered.  Medication reconciliation,  past medical history,  social history, problem list and allergies were reviewed in detail with the patient  Goals were established with regard to weight loss, exercise, and  diet in compliance with medications. She is doing weight watchers and does water aerobics 5 x a week Wt Readings from Last 5 Encounters:  04/27/21 223 lb (101.2 kg)  01/21/21 218 lb (98.9 kg)  07/24/20 194 lb 12.8 oz (88.4 kg)  04/23/20 199 lb (90.3 kg)  12/24/19 192 lb (87.1 kg)   Review of Systems  Constitutional: Negative.   HENT: Negative.    Eyes: Negative.   Respiratory: Negative.    Cardiovascular: Negative.   Gastrointestinal: Negative.   Endocrine: Negative.   Genitourinary: Negative.   Musculoskeletal: Negative.   Skin: Negative.   Allergic/Immunologic: Negative.   Neurological: Negative.   Hematological: Negative.   Psychiatric/Behavioral:  Negative for agitation, decreased concentration and sleep disturbance. The patient is nervous/anxious.    Past Medical History:  Diagnosis Date   Arthritis    Asthma    Chicken pox    Heart murmur    Hyperlipidemia    Hypertension    Hypothyroidism    Osteoporosis    Positive TB test     Social History   Socioeconomic History   Marital status: Widowed    Spouse name: Not on file   Number of children: Not on file   Years of education: Not on file   Highest education level: Not on file  Occupational History   Not on file  Tobacco Use   Smoking status: Never  Smokeless tobacco: Never  Vaping Use   Vaping Use: Never used  Substance and Sexual Activity   Alcohol use: No   Drug use: No   Sexual activity: Not on file  Other Topics Concern   Not on file  Social History Narrative   Retired Therapist, sports    Social Determinants of Radio broadcast assistant Strain: Low Risk    Difficulty of Paying Living Expenses: Not hard at all  Food Insecurity: No Food Insecurity   Worried About Charity fundraiser in the Last Year: Never true   Arboriculturist in the Last Year: Never  true  Transportation Needs: No Transportation Needs   Lack of Transportation (Medical): No   Lack of Transportation (Non-Medical): No  Physical Activity: Sufficiently Active   Days of Exercise per Week: 5 days   Minutes of Exercise per Session: 60 min  Stress: No Stress Concern Present   Feeling of Stress : Not at all  Social Connections: Moderately Isolated   Frequency of Communication with Friends and Family: More than three times a week   Frequency of Social Gatherings with Friends and Family: More than three times a week   Attends Religious Services: Never   Marine scientist or Organizations: Yes   Attends Music therapist: More than 4 times per year   Marital Status: Widowed  Human resources officer Violence: Not At Risk   Fear of Current or Ex-Partner: No   Emotionally Abused: No   Physically Abused: No   Sexually Abused: No    Past Surgical History:  Procedure Laterality Date   BREAST BIOPSY     multiple. None came back positive    FOOT SURGERY Left 2002    Family History  Problem Relation Age of Onset   Heart disease Mother    Stroke Mother    Hyperlipidemia Mother    Pulmonary fibrosis Father     Allergies  Allergen Reactions   Codeine Nausea And Vomiting    Current Outpatient Medications on File Prior to Visit  Medication Sig Dispense Refill   albuterol (PROAIR HFA) 108 (90 Base) MCG/ACT inhaler Inhale 1-2 puffs every 6 (six) hours as needed into the lungs. 6.7 g 3   alendronate (FOSAMAX) 70 MG tablet TAKE 1 TABLET(70 MG) BY MOUTH 1 TIME A WEEK WITH A FULL GLASS OF WATER AND ON AN EMPTY STOMACH 12 tablet 1   aspirin EC 81 MG tablet Take 81 mg by mouth daily.     atorvastatin (LIPITOR) 10 MG tablet TAKE 2 TABLETS BY MOUTH DAILY 180 tablet 1   Calcium-Vitamin D-Vitamin K (VIACTIV CALCIUM PLUS D) 650-12.5-40 MG-MCG-MCG CHEW      Cetirizine HCl (ZYRTEC ALLERGY PO)      glipiZIDE (GLUCOTROL XL) 5 MG 24 hr tablet TAKE 1 TABLET(5 MG) BY MOUTH IN THE  MORNING AND AT BEDTIME 180 tablet 0   levothyroxine (SYNTHROID) 88 MCG tablet TAKE 1 TABLET BY MOUTH DAILY 90 tablet 1   lisinopril (ZESTRIL) 30 MG tablet TAKE 1 TABLET(30 MG) BY MOUTH DAILY 90 tablet 3   valACYclovir (VALTREX) 1000 MG tablet TK 2 TS PO BID 90 tablet 3   No current facility-administered medications on file prior to visit.    BP 110/80   Pulse (!) 104   Temp (!) 97.1 F (36.2 C) (Oral)   Ht '5\' 3"'$  (1.6 m)   Wt 223 lb (101.2 kg)   SpO2 94%   BMI 39.50 kg/m  Objective:   Physical Exam Vitals and nursing note reviewed.  Constitutional:      General: She is not in acute distress.    Appearance: Normal appearance. She is well-developed. She is obese. She is not ill-appearing.  HENT:     Head: Normocephalic and atraumatic.     Right Ear: Tympanic membrane, ear canal and external ear normal. There is no impacted cerumen.     Left Ear: Tympanic membrane, ear canal and external ear normal. There is no impacted cerumen.     Nose: Nose normal. No congestion or rhinorrhea.     Mouth/Throat:     Mouth: Mucous membranes are moist.     Pharynx: Oropharynx is clear. No oropharyngeal exudate or posterior oropharyngeal erythema.  Eyes:     General:        Right eye: No discharge.        Left eye: No discharge.     Extraocular Movements: Extraocular movements intact.     Conjunctiva/sclera: Conjunctivae normal.     Pupils: Pupils are equal, round, and reactive to light.  Neck:     Thyroid: No thyromegaly.     Vascular: No carotid bruit.     Trachea: No tracheal deviation.  Cardiovascular:     Rate and Rhythm: Normal rate and regular rhythm.     Pulses: Normal pulses.     Heart sounds: Normal heart sounds. No murmur heard.   No friction rub. No gallop.  Pulmonary:     Effort: Pulmonary effort is normal. No respiratory distress.     Breath sounds: Normal breath sounds. No stridor. No wheezing, rhonchi or rales.  Chest:     Chest wall: No tenderness.   Abdominal:     General: Abdomen is flat. Bowel sounds are normal. There is no distension.     Palpations: Abdomen is soft. There is no mass.     Tenderness: There is no abdominal tenderness. There is no right CVA tenderness, left CVA tenderness, guarding or rebound.     Hernia: No hernia is present.  Musculoskeletal:        General: No swelling, tenderness, deformity or signs of injury. Normal range of motion.     Cervical back: Normal range of motion and neck supple.     Right lower leg: No edema.     Left lower leg: No edema.  Lymphadenopathy:     Cervical: No cervical adenopathy.  Skin:    General: Skin is warm and dry.     Coloration: Skin is not jaundiced or pale.     Findings: No bruising, erythema, lesion or rash.  Neurological:     General: No focal deficit present.     Mental Status: She is alert and oriented to person, place, and time.     Cranial Nerves: No cranial nerve deficit.     Sensory: No sensory deficit.     Motor: No weakness.     Coordination: Coordination normal.     Gait: Gait normal.     Deep Tendon Reflexes: Reflexes normal.  Psychiatric:        Mood and Affect: Mood normal.        Behavior: Behavior normal.        Thought Content: Thought content normal.        Judgment: Judgment normal.      Assessment & Plan:   1. Type 2 diabetes mellitus without complication, without long-term current use of insulin (HCC) - Continue with Glipzide - CBC  with Differential/Platelet; Future - Comprehensive metabolic panel; Future - Hemoglobin A1c; Future - Lipid panel; Future - TSH; Future  2. Essential hypertension - Well controlled. No change in medications  - CBC with Differential/Platelet; Future - Comprehensive metabolic panel; Future - Hemoglobin A1c; Future - Lipid panel; Future - TSH; Future  3. Osteoporosis without current pathological fracture, unspecified osteoporosis type - Continue with Fosamax - CBC with Differential/Platelet; Future -  Comprehensive metabolic panel; Future - Hemoglobin A1c; Future - Lipid panel; Future - TSH; Future  4. Mixed hyperlipidemia - Consider increase in statin  - CBC with Differential/Platelet; Future - Comprehensive metabolic panel; Future - Hemoglobin A1c; Future - Lipid panel; Future - TSH; Future  5. Hypothyroidism, unspecified type - Consider dose increase of synthroid  - CBC with Differential/Platelet; Future - Comprehensive metabolic panel; Future - Hemoglobin A1c; Future - Lipid panel; Future - TSH; Future  6. Anxiety  - FLUoxetine (PROZAC) 10 MG tablet; Take 1 tablet (10 mg total) by mouth daily.  Dispense: 30 tablet; Refill: 1  7. Colon cancer screening  - Ambulatory referral to Gastroenterology  Dorothyann Peng, NP

## 2021-05-08 ENCOUNTER — Other Ambulatory Visit: Payer: Self-pay | Admitting: Adult Health

## 2021-05-11 ENCOUNTER — Other Ambulatory Visit: Payer: Self-pay | Admitting: Adult Health

## 2021-05-13 ENCOUNTER — Telehealth: Payer: Self-pay | Admitting: Pharmacist

## 2021-05-13 ENCOUNTER — Other Ambulatory Visit: Payer: Self-pay | Admitting: Adult Health

## 2021-05-13 NOTE — Chronic Care Management (AMB) (Signed)
Chronic Care Management Pharmacy Assistant   Name: Caroline Suarez  MRN: [id number] DOB: September 13, 1946  Caroline Suarez is an 75 y.o. year old female who presents for her initial CCM visit with the clinical pharmacist.  Reason for Encounter: Chart Prep for initial visit with Jeni Salles the clinical pharmacist on 05/19/21.   Conditions to be addressed/monitored: HTN, HLD, DMII, and Hypothyroidism and Osteoporosis.   Recent office visits:  04/27/21 Dorothyann Peng NP (PCP) - seen for type 2 diabetes without complication and other chronic conditions. Referral for gastroenterology placed. Patient started on Fluoxetine '10mg'$  daily. Virtual follow up in 30 days.  01/21/21 Dorothyann Peng NP (PCP) - seen for type 2 diabetes without complication and other chronic conditions. No medication changes. Follow up in July/August for CPE.  Recent consult visits:  None.  Hospital visits:  None in previous 6 months  Medications: Outpatient Encounter Medications as of 05/13/2021  Medication Sig   albuterol (PROAIR HFA) 108 (90 Base) MCG/ACT inhaler Inhale 1-2 puffs every 6 (six) hours as needed into the lungs.   alendronate (FOSAMAX) 70 MG tablet TAKE 1 TABLET(70 MG) BY MOUTH 1 TIME A WEEK WITH A FULL GLASS OF WATER AND ON AN EMPTY STOMACH   aspirin EC 81 MG tablet Take 81 mg by mouth daily.   atorvastatin (LIPITOR) 10 MG tablet TAKE 2 TABLETS BY MOUTH DAILY   Calcium-Vitamin D-Vitamin K (VIACTIV CALCIUM PLUS D) 650-12.5-40 MG-MCG-MCG CHEW    Cetirizine HCl (ZYRTEC ALLERGY PO)    FLUoxetine (PROZAC) 10 MG tablet Take 1 tablet (10 mg total) by mouth daily.   glipiZIDE (GLUCOTROL XL) 5 MG 24 hr tablet TAKE 1 TABLET(5 MG) BY MOUTH IN THE MORNING AND AT BEDTIME   levothyroxine (SYNTHROID) 88 MCG tablet TAKE 1 TABLET BY MOUTH DAILY   lisinopril (ZESTRIL) 30 MG tablet TAKE 1 TABLET(30 MG) BY MOUTH DAILY   valACYclovir (VALTREX) 1000 MG tablet TK 2 TS PO BID   No facility-administered encounter medications on  file as of 05/13/2021.   Fill history: ALENDRONATE '70MG'$  TABLETS 05/10/2021 84   ATORVASTATIN '10MG'$  TABLETS 02/12/2021 90   FLUOXETINE '10MG'$  TABLETS 04/27/2021 30   LEVOTHYROXINE SODIUM 88MCG TAB 05/11/2021 90   LISINOPRIL '30MG'$  TABLETS 04/11/2021 90   GLIPIZIDE ER '5MG'$  TABLETS 03/24/2021 90   VALACYCLOVIR 1GM TABLETS 04/27/2021 22   Initial Questions: Have you seen any other providers since your last visit? Yes.  Any changes in your medications or health? No.  Any side effects from any medications? Patient believes her recent start of prozac is giving her headaches.  Do you have an symptoms or problems not managed by your medications? No.  Any concerns about your health right now? No.  Has your provider asked that you check blood pressure, blood sugar, or follow special diet at home? None of the above.  Do you get any type of exercise on a regular basis? Patient attends water aerobics daily and walks.  Can you think of a goal you would like to reach for your health? Maintain current health status.  Do you have any problems getting your medications? No.  Is there anything that you would like to discuss during the appointment? Not at this time.  Please bring medications and supplements to appointment.  Care Gaps:  AWV - completed 05/20/20 Covid - 19 vaccine booster 3 - overdue since 05/03/20 Flu vaccine - due  Star Rating Drugs:  Atorvastatin '10mg'$  - last filled on 02/12/21 90DS at [facility] '5mg'$  -  last filled on 03/24/21 90DS at [facility] Lisinopril '30mg'$  - last filled on 04/11/21 90DS at [facility] [phone]

## 2021-05-17 DIAGNOSIS — Z6841 Body Mass Index (BMI) 40.0 and over, adult: Secondary | ICD-10-CM | POA: Diagnosis not present

## 2021-05-17 DIAGNOSIS — Z1231 Encounter for screening mammogram for malignant neoplasm of breast: Secondary | ICD-10-CM | POA: Diagnosis not present

## 2021-05-17 DIAGNOSIS — Z01419 Encounter for gynecological examination (general) (routine) without abnormal findings: Secondary | ICD-10-CM | POA: Diagnosis not present

## 2021-05-17 DIAGNOSIS — M8588 Other specified disorders of bone density and structure, other site: Secondary | ICD-10-CM | POA: Diagnosis not present

## 2021-05-17 NOTE — Telephone Encounter (Cosign Needed)
2nd attempt. Pts son says he will contact patient about appointment and have her contact me of appointment is correct.

## 2021-05-18 ENCOUNTER — Telehealth: Payer: Self-pay | Admitting: Pharmacist

## 2021-05-18 NOTE — Chronic Care Management (AMB) (Addendum)
    Chronic Care Management Pharmacy Assistant   Name: SHENEQUIA WEEDON  MRN: [id number] DOB: August 19, 1946  05/19/21 APPOINTMENT REMINDER   Marylou Mccoy was reminded to have all medications, supplements and any blood glucose and blood pressure readings available for review with [place]. D, at her telephone visit on 05/19/21 at 9am.   Questions: Have you had any recent office visit or specialist visit outside of [place]? Yes. She saw her gynecologist.   Are there any concerns you would like to discuss during your office visit? No concerns at this time.  Are you having any problems obtaining your medications? Only issue is patient states that the pharmacy keeps sending her double of her statin medication. I advised patient to call her pharmacy and inform them. Patient state she would call them.  If patient has any PAP medications ask if they are having any problems getting their PAP medication or refill? No patient assistance at this time.   Care Gaps:  AWV - last appt 05/20/20. Message sent to [facility] Covid-19 vaccine booster 3 - overdue since 05/03/20 Flu vaccine - due  Star Rating Drug:  Atorvastatin '10mg'$  - last filled on 05/13/21 90DS at [facility] Glipizide '5mg'$  - last filled on 03/24/21 90DS at [facility] Lisinopril '30mg'$  - last filled on 04/11/21 90DS at [facility]  Any gaps in medications fill history? No.  [place]  Clinical Pharmacist Assistant [phone]

## 2021-05-19 ENCOUNTER — Ambulatory Visit (INDEPENDENT_AMBULATORY_CARE_PROVIDER_SITE_OTHER): Payer: Medicare Other | Admitting: Pharmacist

## 2021-05-19 DIAGNOSIS — E119 Type 2 diabetes mellitus without complications: Secondary | ICD-10-CM

## 2021-05-19 DIAGNOSIS — I10 Essential (primary) hypertension: Secondary | ICD-10-CM

## 2021-05-19 NOTE — Progress Notes (Signed)
Chronic Care Management Pharmacy Note  06/23/2021 Name:  Caroline Suarez MRN:  [id number] DOB:  [birth year]  Summary: BP at goal <130/80 TSH not at optimal target for concurrent osteoporosis  Recommendations/Changes made from today's visit: -Recommend decreasing levothyroxine to target higher TSH -Recommended switching to calcium citrate and decreasing supplementation to twice daily -Consider stopping aspirin given lack of ASCVD indication  Plan: Follow up BP assessment in 2-3 months   Subjective: Caroline Suarez is an 75 y.o. year old female who is a primary patient of Dorothyann Peng, NP.  The CCM team was consulted for assistance with disease management and care coordination needs.    Engaged with patient by telephone for initial visit in response to provider referral for pharmacy case management and/or care coordination services.   Consent to Services:  The patient was given the following information about Chronic Care Management services today, agreed to services, and gave verbal consent: 1. CCM service includes personalized support from designated clinical staff supervised by the primary care provider, including individualized plan of care and coordination with other care providers 2. 24/7 contact phone numbers for assistance for urgent and routine care needs. 3. Service will only be billed when office clinical staff spend 20 minutes or more in a month to coordinate care. 4. Only one practitioner may furnish and bill the service in a calendar month. 5.The patient may stop CCM services at any time (effective at the end of the month) by phone call to the office staff. 6. The patient will be responsible for cost sharing (co-pay) of up to 20% of the service fee (after annual deductible is met). Patient agreed to services and consent obtained.  Patient Care Team: Dorothyann Peng, NP as PCP - General (Family Medicine) Viona Gilmore, [facility] as Pharmacist (Pharmacist)  Recent office  visits: 04/27/21 Dorothyann Peng NP (PCP) - seen for type 2 diabetes without complication and other chronic conditions. Referral for gastroenterology placed. Patient started on Fluoxetine 55m daily. Virtual follow up in 30 days.   01/21/21 CDorothyann PengNP (PCP) - seen for type 2 diabetes without complication and other chronic conditions. No medication changes. Follow up in July/August for CPE.  Recent consult visits: 04/01/21 WHarriett Sine(dermatology): Unable to access notes.  Hospital visits: None in previous 6 months   Objective:  Lab Results  Component Value Date   CREATININE 1.08 04/27/2021   BUN 35 (H) 04/27/2021   GFR 50.37 (L) 04/27/2021   GFRNONAA 64 08/31/2020   GFRAA 74 08/31/2020   NA 140 04/27/2021   K 4.1 04/27/2021   CALCIUM 9.7 04/27/2021   CO2 23 04/27/2021   GLUCOSE 132 (H) 04/27/2021    Lab Results  Component Value Date/Time   HGBA1C 6.4 04/27/2021 09:32 AM   HGBA1C 6.0 (A) 01/21/2021 09:05 AM   HGBA1C 6.2 (H) 07/24/2020 10:56 AM   HGBA1C 5.9 12/24/2019 11:09 AM   HGBA1C 5.6 04/23/2019 09:11 AM   HGBA1C 7.4 03/27/2017 12:00 AM   GFR 50.37 (L) 04/27/2021 09:32 AM   GFR 59.07 (L) 04/23/2019 09:37 AM   MICROALBUR 1.9 07/24/2020 10:56 AM    Last diabetic Eye exam:  Lab Results  Component Value Date/Time   HMDIABEYEEXA No Retinopathy 07/31/2020 12:00 AM    Last diabetic Foot exam: No results found for: HMDIABFOOTEX   Lab Results  Component Value Date   CHOL 161 04/27/2021   HDL 50.30 04/27/2021   LDLCALC 84 04/27/2021   TRIG 134.0 04/27/2021   CHOLHDL 3  04/27/2021    Hepatic Function Latest Ref Rng & Units 04/27/2021 04/23/2020 04/23/2019  Total Protein 6.0 - 8.3 g/dL 7.3 6.9 7.0  Albumin 3.5 - 5.2 g/dL 4.1 - 4.3  AST 0 - 37 U/L '16 16 17  ' ALT 0 - 35 U/L '16 13 14  ' Alk Phosphatase 39 - 117 U/L 64 - 61  Total Bilirubin 0.2 - 1.2 mg/dL 0.4 0.2 0.3  Bilirubin, Direct 0.0 - 0.3 mg/dL - - -    Lab Results  Component Value Date/Time   TSH 0.49  04/27/2021 09:32 AM   TSH 0.79 04/23/2020 09:25 AM    CBC Latest Ref Rng & Units 04/27/2021 04/23/2020 04/23/2019  WBC 4.0 - 10.5 K/uL 7.2 7.7 6.8  Hemoglobin 12.0 - 15.0 g/dL 12.2 12.2 12.3  Hematocrit 36.0 - 46.0 % 36.4 37.5 37.7  Platelets 150.0 - 400.0 K/uL 307.0 374 349.0    Lab Results  Component Value Date/Time   VD25OH 50 04/23/2020 09:25 AM    Clinical ASCVD: No  The 10-year ASCVD risk score (Arnett DK, et al., 2019) is: 44.2%   Values used to calculate the score:     Age: 75 years     Sex: Female     Is Non-Hispanic African American: No     Diabetic: Yes     Tobacco smoker: No     Systolic Blood Pressure: 563 mmHg     Is BP treated: Yes     HDL Cholesterol: 50.3 mg/dL     Total Cholesterol: 161 mg/dL    Depression screen [facility] 2/9 04/27/2021 05/20/2020 04/23/2020  Decreased Interest 0 0 0  Down, Depressed, Hopeless 0 0 0  PHQ - 2 Score 0 0 0  Altered sleeping - 0 -  Tired, decreased energy - 0 -  Change in appetite - 0 -  Feeling bad or failure about yourself  - 0 -  Trouble concentrating - 0 -  Moving slowly or fidgety/restless - 0 -  Suicidal thoughts - 0 -  PHQ-9 Score - 0 -  Difficult doing work/chores - Not difficult at all -      Social History   Tobacco Use  Smoking Status Never  Smokeless Tobacco Never   BP Readings from Last 3 Encounters:  04/27/21 110/80  01/21/21 140/80  07/24/20 130/72   Pulse Readings from Last 3 Encounters:  04/27/21 (!) 104  01/21/21 84  07/24/20 86   Wt Readings from Last 3 Encounters:  05/26/21 223 lb (101.2 kg)  04/27/21 223 lb (101.2 kg)  01/21/21 218 lb (98.9 kg)   BMI Readings from Last 3 Encounters:  05/26/21 39.50 kg/m  04/27/21 39.50 kg/m  01/21/21 40.12 kg/m    Assessment/Interventions: Review of patient past medical history, allergies, medications, health status, including review of consultants reports, laboratory and other test data, was performed as part of comprehensive evaluation and provision of  chronic care management services.   SDOH:  (Social Determinants of Health) assessments and interventions performed: Yes SDOH Interventions    Flowsheet Row Most Recent Value  SDOH Interventions   Financial Strain Interventions Intervention Not Indicated  Transportation Interventions Intervention Not Indicated      SDOH Screenings   Alcohol Screen: Not on file  Depression (PHQ2-9): Low Risk    PHQ-2 Score: 0  Financial Resource Strain: Low Risk    Difficulty of Paying Living Expenses: Not hard at all  Food Insecurity: Not on file  Housing: Not on file  Physical Activity: Not on  file  Social Connections: Not on file  Stress: Not on file  Tobacco Use: Low Risk    Smoking Tobacco Use: Never   Smokeless Tobacco Use: Never  Transportation Needs: No Transportation Needs   Lack of Transportation (Medical): No   Lack of Transportation (Non-Medical): No   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 Patient usually gets up and waters her plants every morning. She also has a dog that gets her outside and she has to bring it out in the morning as well as on walks. Her dog is a Maltipoo and only 4 pounds.   She does participate in water aerobics Mon-Fri at her community pool and enjoys this. She has been following weight watchers diet for a while now and this has been the most successful diet program for her. She used to eat out before COVID but does not do that very often now. She eats mostly chicken and vegetables and does eat 3 meals a day.   Patient denies any concerns with cost of medicines. Patient uses a weekly pillbox and very rarely misses doses of her medication. She does believe that she is getting headaches from her Prozac but plans to discuss with her PCP next week.   CCM Care Plan  Allergies  Allergen Reactions   Codeine Nausea And Vomiting    Medications Reviewed Today     Reviewed by Franco Collet, CMA (Certified Medical Assistant) on 05/26/21 at 1024   Med List Status: <None>   Medication Order Taking? Sig Documenting Provider Last Dose Status Informant  albuterol (PROAIR HFA) 108 (90 Base) MCG/ACT inhaler [id number]  Inhale 1-2 puffs every 6 (six) hours as needed into the lungs.  Patient not taking: Reported on 05/19/2021   Dorothyann Peng, NP  Active   alendronate (FOSAMAX) 70 MG tablet [id number]  TAKE 1 TABLET(70 MG) BY MOUTH 1 TIME A WEEK WITH A FULL GLASS OF WATER AND ON AN EMPTY STOMACH Nafziger, Tommi Rumps, NP  Active   aspirin EC 81 MG tablet [id number]  Take 81 mg by mouth daily. [provider]  Active   atorvastatin (LIPITOR) 10 MG tablet [id number]  TAKE 2 TABLETS BY MOUTH DAILY Nafziger, Tommi Rumps, NP  Active   Calcium-Vitamin D-Vitamin K (VIACTIV CALCIUM PLUS D) 650-12.5-40 MG-MCG-MCG CHEW [id number]   [provider]  Active   Cetirizine HCl (ZYRTEC ALLERGY PO) [id number]  10 mg daily. [provider]  Active   FLUoxetine (PROZAC) 10 MG tablet [id number]  Take 1 tablet (10 mg total) by mouth daily. Nafziger, Tommi Rumps, NP  Active   glipiZIDE (GLUCOTROL XL) 5 MG 24 hr tablet [id number]  TAKE 1 TABLET(5 MG) BY MOUTH IN THE MORNING AND AT BEDTIME Nafziger, Tommi Rumps, NP  Active   levothyroxine (SYNTHROID) 88 MCG tablet [id number]  TAKE 1 TABLET BY MOUTH DAILY Nafziger, Tommi Rumps, NP  Active   lisinopril (ZESTRIL) 30 MG tablet [id number]  TAKE 1 TABLET(30 MG) BY MOUTH DAILY Nafziger, Tommi Rumps, NP  Active   valACYclovir (VALTREX) 1000 MG tablet [id number]  TK 2 TS PO BID Nafziger, Tommi Rumps, NP  Active   Wheat Dextrin (BENEFIBER PO) [id number]  Take by mouth. [provider]  Active             Patient Active Problem List   Diagnosis Date Noted   DM (diabetes mellitus) ([place]) 08/02/2017   Positive TB test    Osteoporosis    Hypothyroidism    Hypertension    Hyperlipidemia  Heart murmur    Chicken pox     Immunization History  Administered Date(s) Administered   Fluad Quad(high Dose 65+) 05/20/2019, 07/24/2020   Hepatitis B  03/23/1984   Influenza, High Dose Seasonal PF 06/14/2017, 07/04/2018, 07/04/2018   Influenza-Unspecified 06/25/2016   PFIZER(Purple Top)SARS-COV-2 Vaccination 11/09/2019, 12/02/2019, 06/20/2020, 01/05/2021   Pneumococcal Conjugate-13 10/16/2013   Pneumococcal Polysaccharide-23 06/28/2009, 04/03/2018   Td 10/12/2012   Zoster Recombinat (Shingrix) 02/09/2017, 04/12/2017    Conditions to be addressed/monitored:  Hypertension, Hyperlipidemia, Diabetes, Anxiety, and Osteoporosis  Care Plan : [place]  Updates made by Viona Gilmore, [place] since 06/23/2021 12:00 AM     Problem: Problem: Hypertension, Hyperlipidemia, Diabetes, Anxiety, and Osteoporosis      Long-Range Goal: Patient-Specific Goal   Start Date: 05/19/2021  Expected End Date: 05/19/2022  This Visit's Progress: On track  Priority: High  Note:   Current Barriers:  Unable to independently monitor therapeutic efficacy  Pharmacist Clinical Goal(s):  Patient will achieve adherence to monitoring guidelines and medication adherence to achieve therapeutic efficacy through collaboration with PharmD and provider.   Interventions: 1:1 collaboration with Dorothyann Peng, NP regarding development and update of comprehensive plan of care as evidenced by provider attestation and co-signature Inter-disciplinary care team collaboration (see longitudinal plan of care) Comprehensive medication review performed; medication list updated in electronic medical record  Hypertension (BP goal <130/80) -Controlled -Current treatment: Lisinopril 30 mg 1 tablet daily -Medications previously tried: none  -Current home readings: 130/70 yesterday; does not check but owns a wrist cuff -Current dietary habits: limits salt intake; cooks with it sometimes; doesn't buy much prepackaged foods; eats frozen vegetables and doesn't cook often -Current exercise habits: water aerobics -Denies hypotensive/hypertensive symptoms -Educated on BP goals  and benefits of medications for prevention of heart attack, stroke and kidney damage; Importance of home blood pressure monitoring; Proper BP monitoring technique; -Counseled to monitor BP at home weekly, document, and provide log at future appointments -Counseled on diet and exercise extensively Recommended to continue current medication  Hyperlipidemia: (LDL goal < 100) -Controlled -Current treatment: Atorvastatin 10 mg 2 tablets daily -Medications previously tried: none  -Current dietary patterns: doesn't fry foods; uses canola oil -Current exercise habits: water aerobics and daily walking -Educated on Cholesterol goals;  Benefits of statin for ASCVD risk reduction; Importance of limiting foods high in cholesterol; -Counseled on diet and exercise extensively Recommended to continue current medication  Diabetes (A1c goal <7%) -Controlled -Current medications: Glipizide XL 5 mg 1 tablet twice daily -Medications previously tried: none  -Current home glucose readings fasting glucose: 105 (no lows) post prandial glucose: does not check -Denies hypoglycemic/hyperglycemic symptoms -Current meal patterns:  breakfast: did not discuss  lunch: did not discuss  dinner: did not discuss snacks: popcorn or fruit drinks: water -Current exercise: water aerobics -Educated on A1c and blood sugar goals; Prevention and management of hypoglycemic episodes; Benefits of routine self-monitoring of blood sugar; Carbohydrate counting and/or plate method -Counseled to check feet daily and get yearly eye exams -Counseled on diet and exercise extensively Recommended to continue current medication  Anxiety (Goal: minimize symptoms) -Not ideally controlled -Current treatment: Fluoxetine 10 mg 1 tablet daily -Medications previously tried/failed: none -PHQ9: 0 -GAD7: n/a -Educated on Benefits of medication for symptom control Benefits of cognitive-behavioral therapy with or without  medication -Recommended to continue current medication Counseled on possibility of increasing the dose further.  Hypothyroidism (Goal: TSH 2.5-3.5 based on osteoporosis) -Not ideally controlled -Current treatment  Levothyroxine 88 mcg 1  tablet daily -Medications previously tried: none  -Recommended decreasing the dose to target higher TSH.    Osteoporosis (Goal prevent fractures) -Controlled -Last DEXA Scan: 04/29/2019   T-Score femoral neck: -2.0  T-Score total hip: n/a  T-Score lumbar spine: 0.0  T-Score forearm radius: -0.2  10-year probability of major osteoporotic fracture: n/a  10-year probability of hip fracture: n/a -Patient is a candidate for pharmacologic treatment due to T-Score < -2.5 in femoral neck -Current treatment  Alendronate 70 mg 1 tablet once weekly Calcium carbonate-vitamin D 650 mg - 500 units 1 three times a day -Medications previously tried: none  -Recommend [phone] units of vitamin D daily. Recommend 1200 mg of calcium daily from dietary and supplemental sources. Counseled on oral bisphosphonate administration: take in the morning, 30 minutes prior to food with 6-8 oz of water. Do not lie down for at least 30 minutes after taking. Recommend weight-bearing and muscle strengthening exercises for building and maintaining bone density. -Counseled on diet and exercise extensively Recommended to continue current medication Counseled on taking 30 minutes prior to levothyroxine. Recommended switching to calcium citrate and decreasing calcium to twice daily.   Health Maintenance -Vaccine gaps: COVID booster, influenza -Current therapy:  Valacyclovir 1000 mg 2 tablets twice daily Aspirin 81 mg daily  Cetirizine 10 mg 1 tablet daily Albuterol HFA 1-2 puffs every 6 hours PRN (only uses if she's sick) Benefiber daily -Educated on Cost vs benefit of each product must be carefully weighed by individual consumer -Patient is satisfied with current therapy and denies  issues -Counseled on risk vs benefit of aspirin therapy and recommended discontinuing.  Patient Goals/Self-Care Activities Patient will:  - take medications as prescribed check glucose when symptomatic, document, and provide at future appointments check blood pressure weekly, document, and provide at future appointments target a minimum of 150 minutes of moderate intensity exercise weekly  Follow Up Plan: The care management team will reach out to the patient again over the next 30 days.         Medication Assistance: None required.  Patient affirms current coverage meets needs.  Compliance/Adherence/Medication fill history: Care Gaps: Influenza, COVID booster  Star-Rating Drugs: Atorvastatin 15m - last filled on 05/13/21 90DS at [facility] Glipizide 555m- last filled on 03/24/21 90DS at [facility] Lisinopril 3080m [facility]  [address] [id number]: [phone]: [phone] pill box? Yes Pt endorses 100% compliance  We discussed: Current pharmacy is preferred with insurance plan and patient is satisfied with pharmacy services Patient decided to: Continue current medication management strategy  Care Plan and Follow Up Patient Decision:  Patient agrees to Care Plan and Follow-up.  Plan: The care management team will reach out to the patient again over the next 30 days.  MadJeni SallesharmD, BCASulphur Rockarmacist [place]

## 2021-05-25 ENCOUNTER — Telehealth: Payer: Self-pay | Admitting: Gastroenterology

## 2021-05-25 ENCOUNTER — Telehealth: Payer: Self-pay | Admitting: Internal Medicine

## 2021-05-25 NOTE — Telephone Encounter (Addendum)
Error

## 2021-05-25 NOTE — Telephone Encounter (Signed)
Hi Dr. Fuller Plan,  DOD: 08/30/22AM  We have received a referral from [place]; Dr. Dorothyann Peng for a colon cancer screening for this patient.  Patient had a colonoscopy  in 2016 at [facility], [place], [place].   Records have been received they will be sent for your review.. Please advise on scheduling.. Thank you

## 2021-05-26 ENCOUNTER — Telehealth (INDEPENDENT_AMBULATORY_CARE_PROVIDER_SITE_OTHER): Payer: Medicare Other | Admitting: Adult Health

## 2021-05-26 ENCOUNTER — Encounter: Payer: Self-pay | Admitting: Adult Health

## 2021-05-26 VITALS — Ht 63.0 in | Wt 223.0 lb

## 2021-05-26 DIAGNOSIS — F419 Anxiety disorder, unspecified: Secondary | ICD-10-CM

## 2021-05-26 DIAGNOSIS — I10 Essential (primary) hypertension: Secondary | ICD-10-CM | POA: Diagnosis not present

## 2021-05-26 DIAGNOSIS — E119 Type 2 diabetes mellitus without complications: Secondary | ICD-10-CM

## 2021-05-26 MED ORDER — FLUOXETINE HCL 20 MG PO CAPS: 20.0000 mg | ORAL_CAPSULE | Freq: Every day | ORAL | 1 refills | Status: DC

## 2021-05-26 NOTE — Progress Notes (Signed)
Virtual Visit via Video Note  I connected with Caroline Suarez on 05/26/21 at 10:30 AM EDT by a video enabled telemedicine application and verified that I am speaking with the correct person using two identifiers.  Location patient: home Location provider:work or home office Persons participating in the virtual visit: patient, provider  I discussed the limitations of evaluation and management by telemedicine and the availability of in person appointments. The patient expressed understanding and agreed to proceed.   HPI: 75 year old female who  has a past medical history of Arthritis, Asthma, Chicken pox, Heart murmur, Hyperlipidemia, Hypertension, Hypothyroidism, Osteoporosis, and Positive TB test.  She is being evaluated today for 1 month follow-up regarding anxiety.  When she was seen approximately 4 weeks ago during her CPE she reported a history of anxiety that has been becoming worse over the last few years, since her husband passed away.  She was getting an anxious feeling and feeling as though her heart rate was increasing when doing her activities of daily living such as going to the grocery store.  She did not want to leave the house much due to her anxiety.  She denied panic attacks or depression.  We started her on Prozac 10 mg daily.  She reports that for the first 3 weeks she experienced a mild daily headache but that has now resolved.  She does feel that the Prozac is "helping a little bit".  She continues to feel anxious and is interested in increasing the dose of Prozac.  She has not had any associated depression, suicidal ideation or homicidal ideation    ROS: See pertinent positives and negatives per HPI.  Past Medical History:  Diagnosis Date   Arthritis    Asthma    Chicken pox    Heart murmur    Hyperlipidemia    Hypertension    Hypothyroidism    Osteoporosis    Positive TB test     Past Surgical History:  Procedure Laterality Date   BREAST BIOPSY     multiple.  None came back positive    FOOT SURGERY Left 2002    Family History  Problem Relation Age of Onset   Heart disease Mother    Stroke Mother    Hyperlipidemia Mother    Pulmonary fibrosis Father        Current Outpatient Medications:    albuterol (PROAIR HFA) 108 (90 Base) MCG/ACT inhaler, Inhale 1-2 puffs every 6 (six) hours as needed into the lungs., Disp: 6.7 g, Rfl: 3   alendronate (FOSAMAX) 70 MG tablet, TAKE 1 TABLET(70 MG) BY MOUTH 1 TIME A WEEK WITH A FULL GLASS OF WATER AND ON AN EMPTY STOMACH, Disp: 12 tablet, Rfl: 1   aspirin EC 81 MG tablet, Take 81 mg by mouth daily., Disp: , Rfl:    atorvastatin (LIPITOR) 10 MG tablet, TAKE 2 TABLETS BY MOUTH DAILY, Disp: 180 tablet, Rfl: 1   Calcium-Vitamin D-Vitamin K (VIACTIV CALCIUM PLUS D) 650-12.5-40 MG-MCG-MCG CHEW, , Disp: , Rfl:    Cetirizine HCl (ZYRTEC ALLERGY PO), 10 mg daily., Disp: , Rfl:    FLUoxetine (PROZAC) 10 MG tablet, Take 1 tablet (10 mg total) by mouth daily., Disp: 30 tablet, Rfl: 1   glipiZIDE (GLUCOTROL XL) 5 MG 24 hr tablet, TAKE 1 TABLET(5 MG) BY MOUTH IN THE MORNING AND AT BEDTIME, Disp: 180 tablet, Rfl: 0   levothyroxine (SYNTHROID) 88 MCG tablet, TAKE 1 TABLET BY MOUTH DAILY, Disp: 90 tablet, Rfl: 1   lisinopril (  ZESTRIL) 30 MG tablet, TAKE 1 TABLET(30 MG) BY MOUTH DAILY, Disp: 90 tablet, Rfl: 3   valACYclovir (VALTREX) 1000 MG tablet, TK 2 TS PO BID, Disp: 90 tablet, Rfl: 3   Wheat Dextrin (BENEFIBER PO), Take by mouth., Disp: , Rfl:   EXAM:  VITALS per patient if applicable:  GENERAL: alert, oriented, appears well and in no acute distress  HEENT: atraumatic, conjunttiva clear, no obvious abnormalities on inspection of external nose and ears  NECK: normal movements of the head and neck  LUNGS: on inspection no signs of respiratory distress, breathing rate appears normal, no obvious gross SOB, gasping or wheezing  CV: no obvious cyanosis  MS: moves all visible extremities without noticeable  abnormality  PSYCH/NEURO: pleasant and cooperative, no obvious depression or anxiety, speech and thought processing grossly intact  ASSESSMENT AND PLAN:  Discussed the following assessment and plan:  1. Anxiety -We will increase Prozac to 20 mg daily.  She will follow-up with me in the next few weeks at this dose is not controlling her symptoms adequately. - FLUoxetine (PROZAC) 20 MG capsule; Take 1 capsule (20 mg total) by mouth daily.  Dispense: 90 capsule; Refill: 1      I discussed the assessment and treatment plan with the patient. The patient was provided an opportunity to ask questions and all were answered. The patient agreed with the plan and demonstrated an understanding of the instructions.   The patient was advised to call back or seek an in-person evaluation if the symptoms worsen or if the condition fails to improve as anticipated.   Dorothyann Peng, NP

## 2021-06-15 DIAGNOSIS — M8580 Other specified disorders of bone density and structure, unspecified site: Secondary | ICD-10-CM | POA: Diagnosis not present

## 2021-06-15 NOTE — Telephone Encounter (Signed)
Patient calling to follow up on records review status.

## 2021-06-15 NOTE — Telephone Encounter (Signed)
She has history of colon polyps in the past. Her 09/2009 and 07/2005 colonoscopies were polyp free. Her 12/2014 colonoscopy report is incomplete - only the first page is scanned in [place]. Please obtain the complete 12/2014 colonoscopy report and any associated pathology. Then we will be able to provide a recommendation.

## 2021-06-23 NOTE — Patient Instructions (Signed)
Hi Caroline Suarez,  It was great to get to meet you over the telephone! Below is a summary of some of the topics we discussed.   Please reach out to me if you have any questions or need anything before our follow up!  Best, Maddie  Jeni Salles, PharmD, [facility]   Visit Information   Goals Addressed             This Visit's Progress    Track and Manage My Blood Pressure-Hypertension       Timeframe:  Long-Range Goal Priority:  Medium Start Date:                             Expected End Date:                       Follow Up Date 12/18/21    - check blood pressure weekly - choose a place to take my blood pressure (home, clinic or office, retail store) - write blood pressure results in a log or diary    Why is this important?   You won't feel high blood pressure, but it can still hurt your blood vessels.  High blood pressure can cause heart or kidney problems. It can also cause a stroke.  Making lifestyle changes like losing a little weight or eating less salt will help.  Checking your blood pressure at home and at different times of the day can help to control blood pressure.  If the doctor prescribes medicine remember to take it the way the doctor ordered.  Call the office if you cannot afford the medicine or if there are questions about it.     Notes:        Patient Care Plan: CCM Pharmacy Care Plan     Problem Identified: Problem: Hypertension, Hyperlipidemia, Diabetes, Anxiety, and Osteoporosis      Long-Range Goal: Patient-Specific Goal   Start Date: 05/19/2021  Expected End Date: 05/19/2022  This Visit's Progress: On track  Priority: High  Note:   Current Barriers:  Unable to independently monitor therapeutic efficacy  Pharmacist Clinical Goal(s):  Patient will achieve adherence to monitoring guidelines and medication adherence to achieve therapeutic efficacy through collaboration with PharmD and  provider.   Interventions: 1:1 collaboration with Dorothyann Peng, NP regarding development and update of comprehensive plan of care as evidenced by provider attestation and co-signature Inter-disciplinary care team collaboration (see longitudinal plan of care) Comprehensive medication review performed; medication list updated in electronic medical record  Hypertension (BP goal <130/80) -Controlled -Current treatment: Lisinopril 30 mg 1 tablet daily -Medications previously tried: none  -Current home readings: 130/70 yesterday; does not check but owns a wrist cuff -Current dietary habits: limits salt intake; cooks with it sometimes; doesn't buy much prepackaged foods; eats frozen vegetables and doesn't cook often -Current exercise habits: water aerobics -Denies hypotensive/hypertensive symptoms -Educated on BP goals and benefits of medications for prevention of heart attack, stroke and kidney damage; Importance of home blood pressure monitoring; Proper BP monitoring technique; -Counseled to monitor BP at home weekly, document, and provide log at future appointments -Counseled on diet and exercise extensively Recommended to continue current medication  Hyperlipidemia: (LDL goal < 100) -Controlled -Current treatment: Atorvastatin 10 mg 2 tablets daily -Medications previously tried: none  -Current dietary patterns: doesn't fry foods; uses canola oil -Current exercise habits: water aerobics and daily walking -Educated on Cholesterol  goals;  Benefits of statin for ASCVD risk reduction; Importance of limiting foods high in cholesterol; -Counseled on diet and exercise extensively Recommended to continue current medication  Diabetes (A1c goal <7%) -Controlled -Current medications: Glipizide XL 5 mg 1 tablet twice daily -Medications previously tried: none  -Current home glucose readings fasting glucose: 105 (no lows) post prandial glucose: does not check -Denies  hypoglycemic/hyperglycemic symptoms -Current meal patterns:  breakfast: did not discuss  lunch: did not discuss  dinner: did not discuss snacks: popcorn or fruit drinks: water -Current exercise: water aerobics -Educated on A1c and blood sugar goals; Prevention and management of hypoglycemic episodes; Benefits of routine self-monitoring of blood sugar; Carbohydrate counting and/or plate method -Counseled to check feet daily and get yearly eye exams -Counseled on diet and exercise extensively Recommended to continue current medication  Anxiety (Goal: minimize symptoms) -Not ideally controlled -Current treatment: Fluoxetine 10 mg 1 tablet daily -Medications previously tried/failed: none -PHQ9: 0 -GAD7: n/a -Educated on Benefits of medication for symptom control Benefits of cognitive-behavioral therapy with or without medication -Recommended to continue current medication Counseled on possibility of increasing the dose further.  Hypothyroidism (Goal: TSH 2.5-3.5 based on osteoporosis) -Not ideally controlled -Current treatment  Levothyroxine 88 mcg 1 tablet daily -Medications previously tried: none  -Recommended decreasing the dose to target higher TSH.    Osteoporosis (Goal prevent fractures) -Controlled -Last DEXA Scan: 04/29/2019   T-Score femoral neck: -2.0  T-Score total hip: n/a  T-Score lumbar spine: 0.0  T-Score forearm radius: -0.2  10-year probability of major osteoporotic fracture: n/a  10-year probability of hip fracture: n/a -Patient is a candidate for pharmacologic treatment due to T-Score < -2.5 in femoral neck -Current treatment  Alendronate 70 mg 1 tablet once weekly Calcium carbonate-vitamin D 650 mg - 500 units 1 three times a day -Medications previously tried: none  -Recommend [phone] units of vitamin D daily. Recommend 1200 mg of calcium daily from dietary and supplemental sources. Counseled on oral bisphosphonate administration: take in the morning,  30 minutes prior to food with 6-8 oz of water. Do not lie down for at least 30 minutes after taking. Recommend weight-bearing and muscle strengthening exercises for building and maintaining bone density. -Counseled on diet and exercise extensively Recommended to continue current medication Counseled on taking 30 minutes prior to levothyroxine. Recommended switching to calcium citrate and decreasing calcium to twice daily.   Health Maintenance -Vaccine gaps: COVID booster, influenza -Current therapy:  Valacyclovir 1000 mg 2 tablets twice daily Aspirin 81 mg daily  Cetirizine 10 mg 1 tablet daily Albuterol HFA 1-2 puffs every 6 hours PRN (only uses if she's sick) Benefiber daily -Educated on Cost vs benefit of each product must be carefully weighed by individual consumer -Patient is satisfied with current therapy and denies issues -Counseled on risk vs benefit of aspirin therapy and recommended discontinuing.  Patient Goals/Self-Care Activities Patient will:  - take medications as prescribed check glucose when symptomatic, document, and provide at future appointments check blood pressure weekly, document, and provide at future appointments target a minimum of 150 minutes of moderate intensity exercise weekly  Follow Up Plan: The care management team will reach out to the patient again over the next 30 days.       Ms. Karim was given information about Chronic Care Management services today including:  CCM service includes personalized support from designated clinical staff supervised by her physician, including individualized plan of care and coordination with other care providers 24/7 contact phone numbers for  assistance for urgent and routine care needs. Standard insurance, coinsurance, copays and deductibles apply for chronic care management only during months in which we provide at least 20 minutes of these services. Most insurances cover these services at 100%, however patients  may be responsible for any copay, coinsurance and/or deductible if applicable. This service may help you avoid the need for more expensive face-to-face services. Only one practitioner may furnish and bill the service in a calendar month. The patient may stop CCM services at any time (effective at the end of the month) by phone call to the office staff.  Patient agreed to services and verbal consent obtained.   Patient verbalizes understanding of instructions provided today and agrees to view in [place].  The pharmacy team will reach out to the patient again over the next 30 days.   Viona Gilmore, [facility]

## 2021-06-24 ENCOUNTER — Other Ambulatory Visit: Payer: Self-pay | Admitting: Adult Health

## 2021-06-25 ENCOUNTER — Other Ambulatory Visit: Payer: Self-pay | Admitting: Adult Health

## 2021-06-25 DIAGNOSIS — F419 Anxiety disorder, unspecified: Secondary | ICD-10-CM

## 2021-06-29 ENCOUNTER — Other Ambulatory Visit: Payer: Self-pay | Admitting: Adult Health

## 2021-06-29 ENCOUNTER — Telehealth: Payer: Self-pay | Admitting: Pharmacist

## 2021-06-29 DIAGNOSIS — E039 Hypothyroidism, unspecified: Secondary | ICD-10-CM

## 2021-06-29 MED ORDER — LEVOTHYROXINE SODIUM 75 MCG PO TABS
75.0000 ug | ORAL_TABLET | Freq: Every day | ORAL | 0 refills | Status: DC
Start: 2021-06-29 — End: 2021-06-30

## 2021-06-29 NOTE — Telephone Encounter (Signed)
Called patient to follow up on discussion with PCP from CCM visit. Patient agreed to go ahead and stop taking the aspirin and removed it from her medication list.  Patient is also aware a new prescription for Synthroid will be sent into her pharmacy to target a slightly higher TSH given her fracture risk with osteoporosis.  Scheduled lab follow up TSH for 1 month.

## 2021-06-30 ENCOUNTER — Telehealth: Payer: Self-pay | Admitting: Gastroenterology

## 2021-06-30 ENCOUNTER — Encounter: Payer: Self-pay | Admitting: Adult Health

## 2021-06-30 DIAGNOSIS — M25522 Pain in left elbow: Secondary | ICD-10-CM | POA: Diagnosis not present

## 2021-06-30 DIAGNOSIS — M7542 Impingement syndrome of left shoulder: Secondary | ICD-10-CM | POA: Diagnosis not present

## 2021-06-30 NOTE — Telephone Encounter (Signed)
RECORD REVIEW.  May 25, 2021 telephone note.  Colonoscopy report received from [place], [place] performed January 12, 2015 for colon polyp surveillance.  A few nonbleeding diverticula with small openings were seen in the distal descending colon and sigmoid colon, otherwise normal colonoscopy.  Repeat colonoscopy in 10 years was recommended which will be April 2026.  We agree with the recommendation for a 10-year interval colonoscopy in April 2026 however at that time the patient will be 64 so routine screening may be deferred based on age.  We will place a recall for colonoscopy in April 2026 and review at that time.

## 2021-06-30 NOTE — Telephone Encounter (Signed)
Please advise 

## 2021-06-30 NOTE — Telephone Encounter (Signed)
Patient called back ans was informed of not needing colon until 2026.

## 2021-06-30 NOTE — Telephone Encounter (Signed)
Called patient to advise left voicemail. °

## 2021-07-01 ENCOUNTER — Ambulatory Visit: Payer: Medicare Other | Admitting: Gastroenterology

## 2021-07-07 ENCOUNTER — Other Ambulatory Visit: Payer: Self-pay | Admitting: Adult Health

## 2021-07-07 DIAGNOSIS — I10 Essential (primary) hypertension: Secondary | ICD-10-CM

## 2021-07-28 DIAGNOSIS — M7542 Impingement syndrome of left shoulder: Secondary | ICD-10-CM | POA: Diagnosis not present

## 2021-08-02 ENCOUNTER — Other Ambulatory Visit: Payer: Self-pay

## 2021-08-02 ENCOUNTER — Other Ambulatory Visit (INDEPENDENT_AMBULATORY_CARE_PROVIDER_SITE_OTHER): Payer: Medicare Other

## 2021-08-02 DIAGNOSIS — E039 Hypothyroidism, unspecified: Secondary | ICD-10-CM | POA: Diagnosis not present

## 2021-08-02 LAB — TSH: TSH: 1.05 u[IU]/mL (ref 0.35–5.50)

## 2021-08-04 ENCOUNTER — Other Ambulatory Visit: Payer: Self-pay

## 2021-08-04 DIAGNOSIS — E039 Hypothyroidism, unspecified: Secondary | ICD-10-CM

## 2021-08-04 NOTE — Progress Notes (Signed)
Noted  

## 2021-08-05 ENCOUNTER — Telehealth: Payer: Self-pay | Admitting: Adult Health

## 2021-08-05 ENCOUNTER — Other Ambulatory Visit: Payer: Self-pay

## 2021-08-05 MED ORDER — LEVOTHYROXINE SODIUM 50 MCG PO TABS: 50.0000 ug | ORAL_TABLET | Freq: Every day | ORAL | 0 refills | Status: DC

## 2021-08-05 NOTE — Telephone Encounter (Signed)
Patient notified of update  and verbalized understanding. 

## 2021-08-05 NOTE — Telephone Encounter (Signed)
Patient called to follow up on prescription levothyroxine 50 mcg for that was supposed to be sent in yesterday to [facility].       Please send to  [facility] [place], [place] [address] [place] Phone:  [phone]  Fax:  [phone]       Please advise

## 2021-08-11 ENCOUNTER — Telehealth: Payer: Self-pay | Admitting: Pharmacist

## 2021-08-11 NOTE — Chronic Care Management (AMB) (Signed)
Chronic Care Management Pharmacy Assistant   Name: Caroline Suarez  MRN: [id number] DOB: 07-20-1946  Reason for Encounter: Disease State / Hypertension Assessment Call   Conditions to be addressed/monitored: HTN  Recent office visits:  None  Recent consult visits:  None  Hospital visits:  None  Medications: Outpatient Encounter Medications as of 08/11/2021  Medication Sig   albuterol (PROAIR HFA) 108 (90 Base) MCG/ACT inhaler Inhale 1-2 puffs every 6 (six) hours as needed into the lungs.   alendronate (FOSAMAX) 70 MG tablet TAKE 1 TABLET(70 MG) BY MOUTH 1 TIME A WEEK WITH A FULL GLASS OF WATER AND ON AN EMPTY STOMACH   atorvastatin (LIPITOR) 10 MG tablet TAKE 2 TABLETS BY MOUTH DAILY   Calcium-Vitamin D-Vitamin K (VIACTIV CALCIUM PLUS D) 650-12.5-40 MG-MCG-MCG CHEW    Cetirizine HCl (ZYRTEC ALLERGY PO) 10 mg daily.   FLUoxetine (PROZAC) 20 MG capsule Take 1 capsule (20 mg total) by mouth daily.   glipiZIDE (GLUCOTROL XL) 5 MG 24 hr tablet TAKE 1 TABLET(5 MG) BY MOUTH IN THE MORNING AND AT BEDTIME   levothyroxine (SYNTHROID) 50 MCG tablet Take 1 tablet (50 mcg total) by mouth daily before breakfast.   levothyroxine (SYNTHROID) 75 MCG tablet TAKE 1 TABLET(75 MCG) BY MOUTH DAILY BEFORE BREAKFAST   lisinopril (ZESTRIL) 30 MG tablet TAKE 1 TABLET(30 MG) BY MOUTH DAILY   valACYclovir (VALTREX) 1000 MG tablet TK 2 TS PO BID   Wheat Dextrin (BENEFIBER PO) Take by mouth.   No facility-administered encounter medications on file as of 08/11/2021.   Fill History: ALENDRONATE 70MG  TABLETS 07/31/2021 84   ATORVASTATIN CALCIUM 10MG  TAB 05/13/2021 90   FLUOXETINE 20MG  CAPSULES 05/26/2021 90   LEVOTHYROXINE 0.075MG  (75MCG) TABS 06/30/2021 90   LISINOPRIL 30MG  TABLETS 07/07/2021 90   GLIPIZIDE ER 5MG  TABLETS 06/29/2021 90   VALACYCLOVIR 1GM TABLETS 04/27/2021 22   Reviewed chart prior to disease state call. Spoke with patient regarding BP  Recent Office Vitals: BP Readings from  Last 3 Encounters:  04/27/21 110/80  01/21/21 140/80  07/24/20 130/72   Pulse Readings from Last 3 Encounters:  04/27/21 (!) 104  01/21/21 84  07/24/20 86    Wt Readings from Last 3 Encounters:  05/26/21 223 lb (101.2 kg)  04/27/21 223 lb (101.2 kg)  01/21/21 218 lb (98.9 kg)     Kidney Function Lab Results  Component Value Date/Time   CREATININE 1.08 04/27/2021 09:32 AM   CREATININE 0.89 08/31/2020 09:18 AM   CREATININE 1.19 (H) 07/24/2020 10:56 AM   GFR 50.37 (L) 04/27/2021 09:32 AM   GFRNONAA 64 08/31/2020 09:18 AM   GFRAA 74 08/31/2020 09:18 AM    BMP Latest Ref Rng & Units 04/27/2021 08/31/2020 07/24/2020  Glucose 70 - 99 mg/dL 132(H) 129(H) 141(H)  BUN 6 - 23 mg/dL 35(H) 25 38(H)  Creatinine 0.40 - 1.20 mg/dL 1.08 0.89 1.19(H)  BUN/Creat Ratio 6 - 22 (calc) - NOT APPLICABLE 50(T)  Sodium 135 - 145 mEq/L 140 139 140  Potassium 3.5 - 5.1 mEq/L 4.1 4.3 4.3  Chloride 96 - 112 mEq/L 105 106 103  CO2 19 - 32 mEq/L 23 25 25   Calcium 8.4 - 10.5 mg/dL 9.7 9.5 9.7    Current antihypertensive regimen:  Lisinopril 30 mg  daily  How often are you checking your Blood Pressure? weekly  Current home BP readings: Patient states is is always around 120/80  What recent interventions/DTPs have been made by any provider to improve Blood Pressure control  since last CPP Visit: None  Any recent hospitalizations or ED visits since last visit with CPP? No  What diet changes have been made to improve Blood Pressure Control?  No changes, for breakfast she will have a protein drink, for lunch popcorn and [place] breast and dinners she will have a meat with a vegetable, meat is mostly chicken.  What exercise is being done to improve your Blood Pressure Control?  Patient walks daily  Adherence Review: Is the patient currently on ACE/ARB medication? Yes Does the patient have >5 day gap between last estimated fill dates? No  Care Gaps: AWV - message sent to Ramond Craver Last BP - 110/80  on 04/27/2021 Last A1C - 6.4 on 04/27/2021 Eye exam - overdue  Star Rating Drugs: Atorvastatin 10 mg - last filled 05/13/2021 90 DS at [facility] Glipizide 5 mg - last filled 06/29/2021 90 DS at [facility] Lisinopril 30 mg - last filled 07/07/2021 90 DS at [facility] Pharmacist Assistant [phone]

## 2021-08-31 ENCOUNTER — Other Ambulatory Visit: Payer: Self-pay | Admitting: Adult Health

## 2021-08-31 ENCOUNTER — Other Ambulatory Visit (INDEPENDENT_AMBULATORY_CARE_PROVIDER_SITE_OTHER): Payer: Medicare Other

## 2021-08-31 ENCOUNTER — Encounter: Payer: Self-pay | Admitting: Adult Health

## 2021-08-31 DIAGNOSIS — E039 Hypothyroidism, unspecified: Secondary | ICD-10-CM | POA: Diagnosis not present

## 2021-08-31 LAB — TSH: TSH: 3.76 u[IU]/mL (ref 0.35–5.50)

## 2021-09-27 ENCOUNTER — Other Ambulatory Visit: Payer: Self-pay | Admitting: Adult Health

## 2021-09-27 DIAGNOSIS — E039 Hypothyroidism, unspecified: Secondary | ICD-10-CM

## 2021-10-04 ENCOUNTER — Telehealth: Payer: Self-pay | Admitting: Adult Health

## 2021-10-04 NOTE — Telephone Encounter (Signed)
Left message for patient to call back and schedule Medicare Annual Wellness Visit (AWV) either virtually or in office. Left  my Herbie Drape number [phone]   Last AWV 05/20/20  please schedule at anytime with [facility] 1 or 2   This should be a 45 minute visit.

## 2021-10-06 DIAGNOSIS — H524 Presbyopia: Secondary | ICD-10-CM | POA: Diagnosis not present

## 2021-10-06 DIAGNOSIS — E119 Type 2 diabetes mellitus without complications: Secondary | ICD-10-CM | POA: Diagnosis not present

## 2021-10-06 DIAGNOSIS — Z961 Presence of intraocular lens: Secondary | ICD-10-CM | POA: Diagnosis not present

## 2021-10-06 LAB — HM DIABETES EYE EXAM

## 2021-10-12 ENCOUNTER — Encounter: Payer: Self-pay | Admitting: Adult Health

## 2021-10-20 ENCOUNTER — Other Ambulatory Visit: Payer: Self-pay | Admitting: Adult Health

## 2021-10-28 ENCOUNTER — Encounter: Payer: Self-pay | Admitting: Adult Health

## 2021-10-28 ENCOUNTER — Ambulatory Visit (INDEPENDENT_AMBULATORY_CARE_PROVIDER_SITE_OTHER): Payer: Medicare Other | Admitting: Adult Health

## 2021-10-28 VITALS — BP 100/62 | HR 79 | Temp 98.6°F | Ht 63.0 in | Wt 220.0 lb

## 2021-10-28 DIAGNOSIS — H9190 Unspecified hearing loss, unspecified ear: Secondary | ICD-10-CM

## 2021-10-28 DIAGNOSIS — E119 Type 2 diabetes mellitus without complications: Secondary | ICD-10-CM

## 2021-10-28 DIAGNOSIS — I10 Essential (primary) hypertension: Secondary | ICD-10-CM | POA: Diagnosis not present

## 2021-10-28 LAB — POCT GLYCOSYLATED HEMOGLOBIN (HGB A1C): Hemoglobin A1C: 6.1 % — AB (ref 4.0–5.6)

## 2021-10-28 NOTE — Progress Notes (Signed)
Subjective:    Patient ID: Caroline Suarez, female    DOB: 05/17/1946, 76 y.o.   MRN: [id number]  HPI 76 year old female who  has a past medical history of Arthritis, Asthma, Chicken pox, Heart murmur, Hyperlipidemia, Hypertension, Hypothyroidism, Osteoporosis, and Positive TB test.  She presents to the office today for next month follow-up regarding diabetes and hypertension  Diabetes mellitus-maintained on glipizide 5 mg ER daily.  She does stay active and try and eat a heart healthy diet. Denies hypogylcemic events   Lab Results  Component Value Date   HGBA1C 6.4 04/27/2021   Hypertension-controlled with lisinopril 30 mg daily. She has been feeling " a little of balance and lightheaded" this has been happening over the last week, especially with changing positions. She has been checking her BP at home with readings in the 983-382'N systolic. She is staying well hydrated    BP Readings from Last 3 Encounters:  10/28/21 100/62  04/27/21 110/80  01/21/21 140/80   Wt Readings from Last 3 Encounters:  10/28/21 220 lb (99.8 kg)  05/26/21 223 lb (101.2 kg)  04/27/21 223 lb (101.2 kg)   She would like referral to have a hearing test done. She reports difficulty with hearing conversations in crowds    Review of Systems See HPI   Past Medical History:  Diagnosis Date   Arthritis    Asthma    Chicken pox    Heart murmur    Hyperlipidemia    Hypertension    Hypothyroidism    Osteoporosis    Positive TB test     Social History   Socioeconomic History   Marital status: Widowed    Spouse name: Not on file   Number of children: Not on file   Years of education: Not on file   Highest education level: Not on file  Occupational History   Not on file  Tobacco Use   Smoking status: Never   Smokeless tobacco: Never  Vaping Use   Vaping Use: Never used  Substance and Sexual Activity   Alcohol use: No   Drug use: No   Sexual activity: Not on file  Other Topics Concern    Not on file  Social History Narrative   Retired Therapist, sports    Social Determinants of Radio broadcast assistant Strain: Low Risk    Difficulty of Paying Living Expenses: Not hard at all  Food Insecurity: Not on file  Transportation Needs: No Transportation Needs   Lack of Transportation (Medical): No   Lack of Transportation (Non-Medical): No  Physical Activity: Not on file  Stress: Not on file  Social Connections: Not on file  Intimate Partner Violence: Not on file    Past Surgical History:  Procedure Laterality Date   BREAST BIOPSY     multiple. None came back positive    FOOT SURGERY Left 2002    Family History  Problem Relation Age of Onset   Heart disease Mother    Stroke Mother    Hyperlipidemia Mother    Pulmonary fibrosis Father     Allergies  Allergen Reactions   Codeine Nausea And Vomiting   Erythromycin Nausea And Vomiting    Current Outpatient Medications on File Prior to Visit  Medication Sig Dispense Refill   albuterol (PROAIR HFA) 108 (90 Base) MCG/ACT inhaler Inhale 1-2 puffs every 6 (six) hours as needed into the lungs. 6.7 g 3   alendronate (FOSAMAX) 70 MG tablet TAKE 1 TABLET(70 MG) BY  MOUTH 1 TIME A WEEK WITH A FULL GLASS OF WATER AND ON AN EMPTY STOMACH 12 tablet 1   atorvastatin (LIPITOR) 10 MG tablet TAKE 2 TABLETS BY MOUTH DAILY 180 tablet 1   Cetirizine HCl (ZYRTEC ALLERGY PO) 10 mg daily.     FLUoxetine (PROZAC) 20 MG capsule Take 1 capsule (20 mg total) by mouth daily. 90 capsule 1   glipiZIDE (GLUCOTROL XL) 5 MG 24 hr tablet TAKE 1 TABLET(5 MG) BY MOUTH IN THE MORNING AND AT BEDTIME 180 tablet 0   levothyroxine (SYNTHROID) 50 MCG tablet Take 1 tablet (50 mcg total) by mouth daily before breakfast. 90 tablet 0   lisinopril (ZESTRIL) 30 MG tablet TAKE 1 TABLET(30 MG) BY MOUTH DAILY 90 tablet 3   valACYclovir (VALTREX) 1000 MG tablet TK 2 TS PO BID 90 tablet 3   Wheat Dextrin (BENEFIBER PO) Take by mouth.     No current facility-administered  medications on file prior to visit.    BP 100/62    Pulse 79    Temp 98.6 F (37 C) (Oral)    Ht 5\' 3"  (1.6 m)    Wt 220 lb (99.8 kg)    SpO2 97%    BMI 38.97 kg/m       Objective:   Physical Exam Vitals and nursing note reviewed.  Constitutional:      Appearance: Normal appearance.  HENT:     Right Ear: Tympanic membrane, ear canal and external ear normal. There is no impacted cerumen.     Left Ear: Tympanic membrane, ear canal and external ear normal. There is no impacted cerumen.  Cardiovascular:     Rate and Rhythm: Normal rate and regular rhythm.     Pulses: Normal pulses.     Heart sounds: Normal heart sounds.  Pulmonary:     Effort: Pulmonary effort is normal.     Breath sounds: Normal breath sounds.  Skin:    General: Skin is warm and dry.  Neurological:     General: No focal deficit present.     Mental Status: She is alert and oriented to person, place, and time.  Psychiatric:        Mood and Affect: Mood normal.        Behavior: Behavior normal.        Thought Content: Thought content normal.        Judgment: Judgment normal.       Assessment & Plan:  1. Type 2 diabetes mellitus without complication, without long-term current use of insulin (HCC)  - POC HgB A1c- 6.1 - improved - Continue Glipizide  - Follow up in 6 months for CPE   2. Essential hypertension - Will have her monitor symptoms and BP readings over the next few weeks  - BP in lower side today in the office   3. Hearing loss, unspecified hearing loss type, unspecified laterality  - Ambulatory referral to Audiology

## 2021-10-30 ENCOUNTER — Other Ambulatory Visit: Payer: Self-pay | Admitting: Adult Health

## 2021-11-16 ENCOUNTER — Other Ambulatory Visit: Payer: Self-pay | Admitting: Adult Health

## 2021-11-16 DIAGNOSIS — F419 Anxiety disorder, unspecified: Secondary | ICD-10-CM

## 2021-11-30 ENCOUNTER — Ambulatory Visit (INDEPENDENT_AMBULATORY_CARE_PROVIDER_SITE_OTHER): Payer: Medicare Other

## 2021-11-30 VITALS — Ht 63.0 in | Wt 220.0 lb

## 2021-11-30 DIAGNOSIS — Z0000 Encounter for general adult medical examination without abnormal findings: Secondary | ICD-10-CM

## 2021-11-30 NOTE — Progress Notes (Addendum)
Subjective:   Caroline Suarez is a 76 y.o. female who presents for Medicare Annual (Subsequent) preventive examination.  Review of Systems    Virtual Visit via Telephone Note  I connected with  Caroline Suarez on 11/30/21 at  2:00 PM EST by telephone and verified that I am speaking with the correct person using two identifiers.  Location: Patient: Home Provider: Office Persons participating in the virtual visit: patient/Nurse Health Advisor   I discussed the limitations, risks, security and privacy concerns of performing an evaluation and management service by telephone and the availability of in person appointments. The patient expressed understanding and agreed to proceed.  Interactive audio and video telecommunications were attempted between this nurse and patient, however failed, due to patient having technical difficulties OR patient did not have access to video capability.  We continued and completed visit with audio only.  Some vital signs may be absent or patient reported.   Criselda Peaches, LPN  Cardiac Risk Factors include: advanced age (>100mn, >>25women);diabetes mellitus;hypertension     Objective:    Today's Vitals   11/30/21 1418  Weight: 220 lb (99.8 kg)  Height: '5\' 3"'$  (1.6 m)   Body mass index is 38.97 kg/m.  Advanced Directives 11/30/2021 05/20/2020 04/03/2018  Does Patient Have a Medical Advance Directive? Yes Yes Yes  Type of AParamedicof ACoolidgeLiving will H[place]renLiving will -  Does patient want to make changes to medical advance directive? No - Patient declined No - Patient declined -  Copy of HCarlosin Chart? No - copy requested Yes - validated most recent copy scanned in chart (See row information) -    Current Medications (verified) Outpatient Encounter Medications as of 11/30/2021  Medication Sig   albuterol (PROAIR HFA) 108 (90 Base) MCG/ACT inhaler Inhale 1-2 puffs every 6 (six)  hours as needed into the lungs.   alendronate (FOSAMAX) 70 MG tablet TAKE 1 TABLET(70 MG) BY MOUTH 1 TIME A WEEK WITH A FULL GLASS OF WATER AND ON AN EMPTY STOMACH   atorvastatin (LIPITOR) 10 MG tablet TAKE 2 TABLETS BY MOUTH DAILY   Cetirizine HCl (ZYRTEC ALLERGY PO) 10 mg daily.   FLUoxetine (PROZAC) 20 MG capsule TAKE 1 CAPSULE(20 MG) BY MOUTH DAILY   glipiZIDE (GLUCOTROL XL) 5 MG 24 hr tablet TAKE 1 TABLET(5 MG) BY MOUTH IN THE MORNING AND AT BEDTIME   levothyroxine (SYNTHROID) 50 MCG tablet TAKE 1 TABLET(50 MCG) BY MOUTH DAILY BEFORE BREAKFAST   lisinopril (ZESTRIL) 30 MG tablet TAKE 1 TABLET(30 MG) BY MOUTH DAILY   valACYclovir (VALTREX) 1000 MG tablet TK 2 TS PO BID   Wheat Dextrin (BENEFIBER PO) Take by mouth.   No facility-administered encounter medications on file as of 11/30/2021.    Allergies (verified) Codeine and Erythromycin   History: Past Medical History:  Diagnosis Date   Arthritis    Asthma    Chicken pox    Heart murmur    Hyperlipidemia    Hypertension    Hypothyroidism    Osteoporosis    Positive TB test    Past Surgical History:  Procedure Laterality Date   BREAST BIOPSY     multiple. None came back positive    FOOT SURGERY Left 2002   Family History  Problem Relation Age of Onset   Heart disease Mother    Stroke Mother    Hyperlipidemia Mother    Pulmonary fibrosis Father    Social History  Socioeconomic History   Marital status: Widowed    Spouse name: Not on file   Number of children: Not on file   Years of education: Not on file   Highest education level: Not on file  Occupational History   Not on file  Tobacco Use   Smoking status: Never   Smokeless tobacco: Never  Vaping Use   Vaping Use: Never used  Substance and Sexual Activity   Alcohol use: No   Drug use: No   Sexual activity: Not on file  Other Topics Concern   Not on file  Social History Narrative   Retired Therapist, sports    Social Determinants of Radio broadcast assistant  Strain: Low Risk    Difficulty of Paying Living Expenses: Not hard at all  Food Insecurity: No Food Insecurity   Worried About Charity fundraiser in the Last Year: Never true   Arboriculturist in the Last Year: Never true  Transportation Needs: No Transportation Needs   Lack of Transportation (Medical): No   Lack of Transportation (Non-Medical): No  Physical Activity: Sufficiently Active   Days of Exercise per Week: 7 days   Minutes of Exercise per Session: 40 min  Stress: No Stress Concern Present   Feeling of Stress : Not at all  Social Connections: Moderately Integrated   Frequency of Communication with Friends and Family: More than three times a week   Frequency of Social Gatherings with Friends and Family: More than three times a week   Attends Religious Services: More than 4 times per year   Active Member of [employer] or Organizations: Yes   Attends Archivist Meetings: More than 4 times per year   Marital Status: Widowed   Clinical Intake: Nutrition Risk Assessment:  Has the patient had any N/V/D within the last 2 months?  No  Does the patient have any non-healing wounds?  No  Has the patient had any unintentional weight loss or weight gain?  No   Diabetes:  Is the patient diabetic?  Yes  If diabetic, was a CBG obtained today?  No  Did the patient bring in their glucometer from home?  No  How often do you monitor your CBG's? Daily.   Financial Strains and Diabetes Management:  Are you having any financial strains with the device, your supplies or your medication? No .  Does the patient want to be seen by Chronic Care Management for management of their diabetes?  No  Would the patient like to be referred to a Nutritionist or for Diabetic Management?  No   Diabetic Exams:  Diabetic Eye Exam: Completed Yes. Overdue for diabetic eye exam. Pt has been advised about the importance in completing this exam. A referral has been placed today. Message sent to referral  coordinator for scheduling purposes. Advised pt to expect a call from office referred to regarding appt.  Diabetic Foot Exam: Completed Yes. Pt has been advised about the importance in completing this exam. Pt is scheduled for diabetic foot exam on Followed by PCP.   Pre-visit preparation completed: Yes  Diabetic? Yes   Activities of Daily Living In your present state of health, do you have any difficulty performing the following activities: 11/30/2021 04/27/2021  Hearing? N Y  Vision? N N  Difficulty concentrating or making decisions? N N  Walking or climbing stairs? N N  Dressing or bathing? N N  Doing errands, shopping? N N  Preparing Food and eating ? N -  Using the Toilet? N -  In the past six months, have you accidently leaked urine? Y -  Comment Wears pads -  Do you have problems with loss of bowel control? N -  Managing your Medications? N -  Managing your Finances? N -  Housekeeping or managing your Housekeeping? N -  Some recent data might be hidden    Patient Care Team: Dorothyann Peng, NP as PCP - General (Family Medicine) Viona Gilmore, [facility] as Pharmacist (Pharmacist)  Indicate any recent Medical Services you may have received from other than [facility] providers in the past year (date may be approximate).     Assessment:   This is a routine wellness examination for Caroline Suarez.  Hearing/Vision screen Hearing Screening - Comments:: Wears hearing aids Vision Screening - Comments:: Wears reading glasses. Followed by Dr Ellie Lunch.  Dietary issues and exercise activities discussed: Exercise limited by: None identified   Goals Addressed               This Visit's Progress     Patient Stated (pt-stated)        I will continue to do water aerobics for 1 hour 5 days a week.       Depression Screen PHQ 2/9 Scores 11/30/2021 11/30/2021 10/28/2021 04/27/2021 05/20/2020 04/23/2020 04/23/2019  PHQ - 2 Score 0 0 0 0 0 0 0  PHQ- 9 Score 0 - 0 - 0 - -    Fall Risk Fall Risk  11/30/2021  10/28/2021 04/27/2021 05/20/2020 04/23/2020  Falls in the past year? 0 0 0 0 0  Comment - - - - -  Number falls in past yr: 0 0 0 0 -  Injury with Fall? 0 0 0 0 -  Risk for fall due to : No Fall Risks - - Medication side effect -  Follow up - - - Falls evaluation completed;Falls prevention discussed -    FALL RISK PREVENTION PERTAINING TO THE HOME:  Any stairs in or around the home? Yes  If so, are there any without handrails? No  Home free of loose throw rugs in walkways, pet beds, electrical cords, etc? Yes  Adequate lighting in your home to reduce risk of falls? Yes   ASSISTIVE DEVICES UTILIZED TO PREVENT FALLS:  Life alert? Yes  Use of a cane, walker or w/c? No  Grab bars in the bathroom? Yes  Shower chair or bench in shower? Yes  Elevated toilet seat or a handicapped toilet? No   TIMED UP AND GO:  Was the test performed? No . Audio Visit  Cognitive Function: MMSE - Mini Mental State Exam 04/03/2018  Not completed: (No Data)     6CIT Screen 11/30/2021 05/20/2020  What Year? 0 points 0 points  What month? 0 points 0 points  What time? 0 points 0 points  Count back from 20 0 points 0 points  Months in reverse 0 points 0 points  Repeat phrase 0 points 0 points  Total Score 0 0    Immunizations Immunization History  Administered Date(s) Administered   Fluad Quad(high Dose 65+) 05/20/2019, 07/24/2020, 06/13/2021   Hepatitis B [birth year]1984   Influenza, High Dose Seasonal PF 06/14/2017, 07/04/2018, 07/04/2018   Influenza-Unspecified 06/25/2016   PFIZER(Purple Top)SARS-COV-2 Vaccination 11/09/2019, 12/02/2019, 06/20/2020, 01/05/2021   Pfizer Covid-19 Vaccine Bivalent Booster 61yr & up 06/13/2021   Pneumococcal Conjugate-13 10/16/2013   Pneumococcal Polysaccharide-23 06/28/2009, 04/03/2018   Td 10/12/2012   Zoster Recombinat (Shingrix) 02/09/2017, 04/12/2017    TDAP status: Up to  date  Flu Vaccine status: Up to date  Pneumococcal vaccine status: Up to date  Covid-19  vaccine status: Completed vaccines  Qualifies for Shingles Vaccine? Yes   Zostavax completed Yes   Shingrix Completed?: Yes  Screening Tests Health Maintenance  Topic Date Due   FOOT EXAM  04/27/2022   HEMOGLOBIN A1C  04/27/2022   OPHTHALMOLOGY EXAM  10/06/2022   TETANUS/TDAP  10/12/2022   COLONOSCOPY (Pts 45-90yr Insurance coverage will need to be confirmed)  01/11/2025   Pneumonia Vaccine 76 Years old  Completed   INFLUENZA VACCINE  Completed   DEXA SCAN  Completed   COVID-19 Vaccine  Completed   Hepatitis C Screening  Completed   Zoster Vaccines- Shingrix  Completed   HPV VACCINES  Aged Out    Health Maintenance  There are no preventive care reminders to display for this patient.  Colorectal cancer screening: Type of screening: Colonoscopy. Completed 01/12/15. Repeat every 10 years  Mammogram status: No longer required due to Age.  Bone Density status: Completed 04/29/19. Results reflect: Bone density results: OSTEOPOROSIS. Repeat every 2 years.  Lung Cancer Screening: (Low Dose CT Chest recommended if Age 76-80years, 30 pack-year currently smoking OR have quit w/in 15years.) does not qualify.    Additional Screening:  Hepatitis C Screening: does qualify; Completed 04/03/18  Vision Screening: Recommended annual ophthalmology exams for early detection of glaucoma and other disorders of the eye. Is the patient up to date with their annual eye exam?  Yes  Who is the provider or what is the name of the office in which the patient attends annual eye exams? Dr MEllie LunchIf pt is not established with a provider, would they like to be referred to a provider to establish care? No .   Dental Screening: Recommended annual dental exams for proper oral hygiene  Community Resource Referral / Chronic Care Management:  CRR required this visit?  No   CCM required this visit?  No      Plan:     I have personally reviewed and noted the following in the patients chart:    Medical and social history Use of alcohol, tobacco or illicit drugs  Current medications and supplements including opioid prescriptions.  Functional ability and status Nutritional status Physical activity Advanced directives List of other physicians Hospitalizations, surgeries, and ER visits in previous 12 months Vitals Screenings to include cognitive, depression, and falls Referrals and appointments  In addition, I have reviewed and discussed with patient certain preventive protocols, quality metrics, and best practice recommendations. A written personalized care plan for preventive services as well as general preventive health recommendations were provided to patient.     BCriselda Peaches LPN   31/02/6062  Nurse Notes: Patient request f/u to discuss med/fluoxetine advised dosage and adjustment.

## 2021-11-30 NOTE — Patient Instructions (Addendum)
Caroline Suarez , Thank you for taking time to come for your Medicare Wellness Visit. I appreciate your ongoing commitment to your health goals. Please review the following plan we discussed and let me know if I can assist you in the future.   These are the goals we discussed:  Goals       Exercise 150 min/wk Moderate Activity      Join water aerobics class !       Patient Stated (pt-stated)      I will continue to do water aerobics for 1 hour 5 days a week.      Track and Manage My Blood Pressure-Hypertension      Timeframe:  Long-Range Goal Priority:  Medium Start Date:                             Expected End Date:                       Follow Up Date 12/18/21    - check blood pressure weekly - choose a place to take my blood pressure (home, clinic or office, retail store) - write blood pressure results in a log or diary    Why is this important?   You won't feel high blood pressure, but it can still hurt your blood vessels.  High blood pressure can cause heart or kidney problems. It can also cause a stroke.  Making lifestyle changes like losing a little weight or eating less salt will help.  Checking your blood pressure at home and at different times of the day can help to control blood pressure.  If the doctor prescribes medicine remember to take it the way the doctor ordered.  Call the office if you cannot afford the medicine or if there are questions about it.     Notes:         This is a list of the screening recommended for you and due dates:  Health Maintenance  Topic Date Due   Complete foot exam   04/27/2022   Hemoglobin A1C  04/27/2022   Eye exam for diabetics  10/06/2022   Tetanus Vaccine  10/12/2022   Colon Cancer Screening  01/11/2025   Pneumonia Vaccine  Completed   Flu Shot  Completed   DEXA scan (bone density measurement)  Completed   COVID-19 Vaccine  Completed   Hepatitis C Screening: USPSTF Recommendation to screen - Ages 13-79 yo.  Completed    Zoster (Shingles) Vaccine  Completed   HPV Vaccine  Aged Out     Advanced directives: Yes Copies on file  Conditions/risks identified: None  Next appointment: Follow up in one year for your annual wellness visit    Preventive Care 65 Years and Older, Female Preventive care refers to lifestyle choices and visits with your health care provider that can promote health and wellness. What does preventive care include? A yearly physical exam. This is also called an annual well check. Dental exams once or twice a year. Routine eye exams. Ask your health care provider how often you should have your eyes checked. Personal lifestyle choices, including: Daily care of your teeth and gums. Regular physical activity. Eating a healthy diet. Avoiding tobacco and drug use. Limiting alcohol use. Practicing safe sex. Taking low-dose aspirin every day. Taking vitamin and mineral supplements as recommended by your health care provider. What happens during an annual well check? The services and  screenings done by your health care provider during your annual well check will depend on your age, overall health, lifestyle risk factors, and family history of disease. Counseling  Your health care provider may ask you questions about your: Alcohol use. Tobacco use. Drug use. Emotional well-being. Home and relationship well-being. Sexual activity. Eating habits. History of falls. Memory and ability to understand (cognition). Work and work Statistician. Reproductive health. Screening  You may have the following tests or measurements: Height, weight, and BMI. Blood pressure. Lipid and cholesterol levels. These may be checked every 5 years, or more frequently if you are over 94 years old. Skin check. Lung cancer screening. You may have this screening every year starting at age 29 if you have a 30-pack-year history of smoking and currently smoke or have quit within the past 15 years. Fecal occult blood  test (FOBT) of the stool. You may have this test every year starting at age 48. Flexible sigmoidoscopy or colonoscopy. You may have a sigmoidoscopy every 5 years or a colonoscopy every 10 years starting at age 41. Hepatitis C blood test. Hepatitis B blood test. Sexually transmitted disease (STD) testing. Diabetes screening. This is done by checking your blood sugar (glucose) after you have not eaten for a while (fasting). You may have this done every 1-3 years. Bone density scan. This is done to screen for osteoporosis. You may have this done starting at age 57. Mammogram. This may be done every 1-2 years. Talk to your health care provider about how often you should have regular mammograms. Talk with your health care provider about your test results, treatment options, and if necessary, the need for more tests. Vaccines  Your health care provider may recommend certain vaccines, such as: Influenza vaccine. This is recommended every year. Tetanus, diphtheria, and acellular pertussis (Tdap, Td) vaccine. You may need a Td booster every 10 years. Zoster vaccine. You may need this after age 76. Pneumococcal 13-valent conjugate (PCV13) vaccine. One dose is recommended after age 46. Pneumococcal polysaccharide (PPSV23) vaccine. One dose is recommended after age 29. Talk to your health care provider about which screenings and vaccines you need and how often you need them. This information is not intended to replace advice given to you by your health care provider. Make sure you discuss any questions you have with your health care provider. Document Released: 10/09/2015 Document Revised: 06/01/2016 Document Reviewed: 07/14/2015 Elsevier Interactive Patient Education  2017 [place] Prevention in the Home Falls can cause injuries. They can happen to people of all ages. There are many things you can do to make your home safe and to help prevent falls. What can I do on the outside of my  home? Regularly fix the edges of walkways and driveways and fix any cracks. Remove anything that might make you trip as you walk through a door, such as a raised step or threshold. Trim any bushes or trees on the path to your home. Use bright outdoor lighting. Clear any walking paths of anything that might make someone trip, such as rocks or tools. Regularly check to see if handrails are loose or broken. Make sure that both sides of any steps have handrails. Any raised decks and porches should have guardrails on the edges. Have any leaves, snow, or ice cleared regularly. Use sand or salt on walking paths during winter. Clean up any spills in your garage right away. This includes oil or grease spills. What can I do in the bathroom? Use night  lights. Install grab bars by the toilet and in the tub and shower. Do not use towel bars as grab bars. Use non-skid mats or decals in the tub or shower. If you need to sit down in the shower, use a plastic, non-slip stool. Keep the floor dry. Clean up any water that spills on the floor as soon as it happens. Remove soap buildup in the tub or shower regularly. Attach bath mats securely with double-sided non-slip rug tape. Do not have throw rugs and other things on the floor that can make you trip. What can I do in the bedroom? Use night lights. Make sure that you have a light by your bed that is easy to reach. Do not use any sheets or blankets that are too big for your bed. They should not hang down onto the floor. Have a firm chair that has side arms. You can use this for support while you get dressed. Do not have throw rugs and other things on the floor that can make you trip. What can I do in the kitchen? Clean up any spills right away. Avoid walking on wet floors. Keep items that you use a lot in easy-to-reach places. If you need to reach something above you, use a strong step stool that has a grab bar. Keep electrical cords out of the way. Do  not use floor polish or wax that makes floors slippery. If you must use wax, use non-skid floor wax. Do not have throw rugs and other things on the floor that can make you trip. What can I do with my stairs? Do not leave any items on the stairs. Make sure that there are handrails on both sides of the stairs and use them. Fix handrails that are broken or loose. Make sure that handrails are as long as the stairways. Check any carpeting to make sure that it is firmly attached to the stairs. Fix any carpet that is loose or worn. Avoid having throw rugs at the top or bottom of the stairs. If you do have throw rugs, attach them to the floor with carpet tape. Make sure that you have a light switch at the top of the stairs and the bottom of the stairs. If you do not have them, ask someone to add them for you. What else can I do to help prevent falls? Wear shoes that: Do not have high heels. Have rubber bottoms. Are comfortable and fit you well. Are closed at the toe. Do not wear sandals. If you use a stepladder: Make sure that it is fully opened. Do not climb a closed stepladder. Make sure that both sides of the stepladder are locked into place. Ask someone to hold it for you, if possible. Clearly mark and make sure that you can see: Any grab bars or handrails. First and last steps. Where the edge of each step is. Use tools that help you move around (mobility aids) if they are needed. These include: Canes. Walkers. Scooters. Crutches. Turn on the lights when you go into a dark area. Replace any light bulbs as soon as they burn out. Set up your furniture so you have a clear path. Avoid moving your furniture around. If any of your floors are uneven, fix them. If there are any pets around you, be aware of where they are. Review your medicines with your doctor. Some medicines can make you feel dizzy. This can increase your chance of falling. Ask your doctor what other things that you  can do to  help prevent falls. This information is not intended to replace advice given to you by your health care provider. Make sure you discuss any questions you have with your health care provider. Document Released: 07/09/2009 Document Revised: 02/18/2016 Document Reviewed: 10/17/2014 Elsevier Interactive Patient Education  2017 [employer].

## 2021-12-03 ENCOUNTER — Telehealth: Payer: Self-pay | Admitting: Pharmacist

## 2021-12-03 NOTE — Chronic Care Management (AMB) (Signed)
? ? ?Chronic Care Management ?Pharmacy Assistant  ? ?Name: Caroline Suarez  MRN: [id number] DOB: September 16, 1946 ? ?Reason for Encounter: Disease State / Hypertension Assessment Call ?  ?Conditions to be addressed/monitored: ?HTN ? ? ?Recent office visits:  ?11/30/2021 Rolene Arbour LPN - Medicare annual wellness exam ? ?10/28/2021 Dorothyann Peng, NP - Patient was seen for Type 2 diabetes mellitus without complication, without long-term current use of insulin and additional issues. No medication changes. No follow up noted.  ? ?Recent consult visits:  ?None ? ?Hospital visits:  ?None ? ?Medications: ?Outpatient Encounter Medications as of 12/03/2021  ?Medication Sig  ? albuterol (PROAIR HFA) 108 (90 Base) MCG/ACT inhaler Inhale 1-2 puffs every 6 (six) hours as needed into the lungs.  ? alendronate (FOSAMAX) 70 MG tablet TAKE 1 TABLET(70 MG) BY MOUTH 1 TIME A WEEK WITH A FULL GLASS OF WATER AND ON AN EMPTY STOMACH  ? atorvastatin (LIPITOR) 10 MG tablet TAKE 2 TABLETS BY MOUTH DAILY  ? Cetirizine HCl (ZYRTEC ALLERGY PO) 10 mg daily.  ? FLUoxetine (PROZAC) 20 MG capsule TAKE 1 CAPSULE(20 MG) BY MOUTH DAILY  ? glipiZIDE (GLUCOTROL XL) 5 MG 24 hr tablet TAKE 1 TABLET(5 MG) BY MOUTH IN THE MORNING AND AT BEDTIME  ? levothyroxine (SYNTHROID) 50 MCG tablet TAKE 1 TABLET(50 MCG) BY MOUTH DAILY BEFORE BREAKFAST  ? lisinopril (ZESTRIL) 30 MG tablet TAKE 1 TABLET(30 MG) BY MOUTH DAILY  ? valACYclovir (VALTREX) 1000 MG tablet TK 2 TS PO BID  ? Wheat Dextrin (BENEFIBER PO) Take by mouth.  ? ?No facility-administered encounter medications on file as of 12/03/2021.  ?Fill History: ?ALENDRONATE '70MG'$  TABLETS 10/23/2021 84  ? ?ATORVASTATIN CALCIUM '10MG'$  TAB 05/13/2021 90  ? ?FLUOXETINE '20MG'$  CAPSULES 11/16/2021 90  ? ?LEVOTHYROXINE 0.'05MG'$  (50MCG) TAB 11/01/2021 90  ? ?LISINOPRIL '30MG'$  TABLETS 10/04/2021 90  ? ?GLIPIZIDE ER '5MG'$  TABLETS 09/29/2021 90  ? ?Reviewed chart prior to disease state call. Spoke with patient regarding BP ? ?Recent Office  Vitals: ?BP Readings from Last 3 Encounters:  ?10/28/21 100/62  ?04/27/21 110/80  ?01/21/21 140/80  ? ?Pulse Readings from Last 3 Encounters:  ?10/28/21 79  ?04/27/21 (!) 104  ?01/21/21 84  ?  ?Wt Readings from Last 3 Encounters:  ?11/30/21 220 lb (99.8 kg)  ?10/28/21 220 lb (99.8 kg)  ?05/26/21 223 lb (101.2 kg)  ?  ? ?Kidney Function ?Lab Results  ?Component Value Date/Time  ? CREATININE 1.08 04/27/2021 09:32 AM  ? CREATININE 0.89 08/31/2020 09:18 AM  ? CREATININE 1.19 (H) 07/24/2020 10:56 AM  ? GFR 50.37 (L) 04/27/2021 09:32 AM  ? GFRNONAA 64 08/31/2020 09:18 AM  ? GFRAA 74 08/31/2020 09:18 AM  ? ? ?BMP Latest Ref Rng & Units 04/27/2021 08/31/2020 07/24/2020  ?Glucose 70 - 99 mg/dL 132(H) 129(H) 141(H)  ?BUN 6 - 23 mg/dL 35(H) 25 38(H)  ?Creatinine 0.40 - 1.20 mg/dL 1.08 0.89 1.19(H)  ?BUN/Creat Ratio 6 - 22 (calc) - NOT APPLICABLE 21(H)  ?Sodium 135 - 145 mEq/L 140 139 140  ?Potassium 3.5 - 5.1 mEq/L 4.1 4.3 4.3  ?Chloride 96 - 112 mEq/L 105 106 103  ?CO2 19 - 32 mEq/L '23 25 25  '$ ?Calcium 8.4 - 10.5 mg/dL 9.7 9.5 9.7  ? ? ?Current antihypertensive regimen:  ?Lisinopril 30 mg daily ? ?How often are you checking your Blood Pressure? Patient is checking twice daily.  ? ?Current home BP readings:   ?       Lisinopril 30 mg blood pressures were between 105/70  and 110/67 ?       Lisinopril 15 mg blood pressures were between 122/64 and 141/75 ?       Lisinopril 10 mg blood pressures were between 118/68 and 130/63 ?       Lisinopril 20 mg blood pressures are staying steady around 126/63 ?       Patient feels 20 mg is the correct dose, she plans to contact [facility]   ?       Nafziger to let him know.       ? ?What recent interventions/DTPs have been made by any provider to improve Blood Pressure control since last CPP Visit: Patient has been working with Dorothyann Peng with finding the correct dose of Lisinopril, patient has previous scripts for Lisinopril at 20 mg and 10 mg tablets.  ? ?Any recent hospitalizations or ED visits  since last visit with CPP? No recent hospital visits.  ? ?What diet changes have been made to improve Blood Pressure Control?  ?Patient doesn't follow any type of diet ?Breakfast - patient will have protein drink ?Lunch - patient will have popcorn and a meat like [place] breast ?Dinner - patient will have a meal with a meat and vegetable, meat is mostly chicken.  ? ?What exercise is being done to improve your Blood Pressure Control?  ?Patient walks daily ? ?Adherence Review: ?Is the patient currently on ACE/ARB medication? Yes ?Does the patient have >5 day gap between last estimated fill dates? No ? ?Care Gaps: ?AWV - scheduled 12/01/2020 ?Last BP - 100/62 on 10/28/2021 ?Last A1C - 6.1 on 10/28/2021 ?  ?Star Rating Drugs: ?Atorvastatin 10 mg - last filled 05/13/2021 90 DS at [facility] verified with pharmacy tech. ?Glipizide 5 mg - last filled 09/29/2021 90 DS at [facility] ?Lisinopril 30 mg - last filled 10/04/2021 90 DS at [facility] ?  ? ?Gennie Alma CMA  ?Clinical Pharmacist Assistant ?[phone] ? ?

## 2021-12-24 ENCOUNTER — Other Ambulatory Visit: Payer: Self-pay | Admitting: Adult Health

## 2021-12-30 ENCOUNTER — Other Ambulatory Visit: Payer: Self-pay | Admitting: Adult Health

## 2022-01-17 ENCOUNTER — Other Ambulatory Visit: Payer: Self-pay | Admitting: Adult Health

## 2022-01-18 ENCOUNTER — Other Ambulatory Visit: Payer: Medicare Other

## 2022-01-18 NOTE — Telephone Encounter (Signed)
Pt notified of lab and Rx change. Pt needs a follow up for TSH check.  ?

## 2022-01-21 DIAGNOSIS — M1711 Unilateral primary osteoarthritis, right knee: Secondary | ICD-10-CM | POA: Diagnosis not present

## 2022-02-09 ENCOUNTER — Ambulatory Visit (INDEPENDENT_AMBULATORY_CARE_PROVIDER_SITE_OTHER): Payer: Medicare Other

## 2022-02-09 ENCOUNTER — Encounter: Payer: Self-pay | Admitting: Adult Health

## 2022-02-09 ENCOUNTER — Ambulatory Visit (INDEPENDENT_AMBULATORY_CARE_PROVIDER_SITE_OTHER): Payer: Medicare Other | Admitting: Adult Health

## 2022-02-09 VITALS — BP 130/80 | HR 85 | Temp 98.4°F | Ht 63.0 in | Wt 232.0 lb

## 2022-02-09 DIAGNOSIS — T148XXA Other injury of unspecified body region, initial encounter: Secondary | ICD-10-CM

## 2022-02-09 DIAGNOSIS — R109 Unspecified abdominal pain: Secondary | ICD-10-CM

## 2022-02-09 DIAGNOSIS — M549 Dorsalgia, unspecified: Secondary | ICD-10-CM | POA: Diagnosis not present

## 2022-02-09 DIAGNOSIS — I700 Atherosclerosis of aorta: Secondary | ICD-10-CM | POA: Diagnosis not present

## 2022-02-09 MED ORDER — CYCLOBENZAPRINE HCL 10 MG PO TABS
10.0000 mg | ORAL_TABLET | Freq: Three times a day (TID) | ORAL | 0 refills | Status: DC | PRN
Start: 2022-02-09 — End: 2022-12-02

## 2022-02-09 NOTE — Progress Notes (Signed)
? ?Subjective:  ? ? Patient ID: Caroline Suarez, female    DOB: May 25, 1946, 76 y.o.   MRN: [id number] ? ?HPI ?76 year old female who  has a past medical history of Arthritis, Asthma, Chicken pox, Heart murmur, Hyperlipidemia, Hypertension, Hypothyroidism, Osteoporosis, and Positive TB test. ? ?She presents to the office today for an acute issue. She reports that she fell three days ago. She was walking in her home with a new pair of shoes and the grip of the shoes caught on her floor face first. She did not hit her head or have any LOC. She reports discomfort mostly in her left back and left flank but some pain in her right flank. Pain is worse with deep breathing, talking, and laughing and it feels like a " muscle spasm". Does not believe she fractured a rib  ? ?Has been using a heating pad which helps.  ? ? ?Review of Systems ?See HPI  ? ?Past Medical History:  ?Diagnosis Date  ? Arthritis   ? Asthma   ? Chicken pox   ? Heart murmur   ? Hyperlipidemia   ? Hypertension   ? Hypothyroidism   ? Osteoporosis   ? Positive TB test   ? ? ?Social History  ? ?Socioeconomic History  ? Marital status: Widowed  ?  Spouse name: Not on file  ? Number of children: Not on file  ? Years of education: Not on file  ? Highest education level: Not on file  ?Occupational History  ? Not on file  ?Tobacco Use  ? Smoking status: Never  ? Smokeless tobacco: Never  ?Vaping Use  ? Vaping Use: Never used  ?Substance and Sexual Activity  ? Alcohol use: No  ? Drug use: No  ? Sexual activity: Not on file  ?Other Topics Concern  ? Not on file  ?Social History Narrative  ? Retired Therapist, sports   ? ?Social Determinants of Health  ? ?Financial Resource Strain: Low Risk   ? Difficulty of Paying Living Expenses: Not hard at all  ?Food Insecurity: No Food Insecurity  ? Worried About Charity fundraiser in the Last Year: Never true  ? Ran Out of Food in the Last Year: Never true  ?Transportation Needs: No Transportation Needs  ? Lack of Transportation (Medical): No  ?  Lack of Transportation (Non-Medical): No  ?Physical Activity: Sufficiently Active  ? Days of Exercise per Week: 7 days  ? Minutes of Exercise per Session: 40 min  ?Stress: No Stress Concern Present  ? Feeling of Stress : Not at all  ?Social Connections: Moderately Integrated  ? Frequency of Communication with Friends and Family: More than three times a week  ? Frequency of Social Gatherings with Friends and Family: More than three times a week  ? Attends Religious Services: More than 4 times per year  ? Active Member of Clubs or Organizations: Yes  ? Attends Archivist Meetings: More than 4 times per year  ? Marital Status: Widowed  ?Intimate Partner Violence: Not At Risk  ? Fear of Current or Ex-Partner: No  ? Emotionally Abused: No  ? Physically Abused: No  ? Sexually Abused: No  ? ? ?Past Surgical History:  ?Procedure Laterality Date  ? BREAST BIOPSY    ? multiple. None came back positive   ? FOOT SURGERY Left 2002  ? ? ?Family History  ?Problem Relation Age of Onset  ? Heart disease Mother   ? Stroke Mother   ?  Hyperlipidemia Mother   ? Pulmonary fibrosis Father   ? ? ?Allergies  ?Allergen Reactions  ? Codeine Nausea And Vomiting  ? Erythromycin Nausea And Vomiting  ? ? ?Current Outpatient Medications on File Prior to Visit  ?Medication Sig Dispense Refill  ? albuterol (PROAIR HFA) 108 (90 Base) MCG/ACT inhaler Inhale 1-2 puffs every 6 (six) hours as needed into the lungs. 6.7 g 3  ? alendronate (FOSAMAX) 70 MG tablet TAKE 1 TABLET(70 MG) BY MOUTH 1 TIME A WEEK WITH A FULL GLASS OF WATER AND ON AN EMPTY STOMACH 12 tablet 1  ? atorvastatin (LIPITOR) 10 MG tablet TAKE 2 TABLETS BY MOUTH DAILY 180 tablet 1  ? Cetirizine HCl (ZYRTEC ALLERGY PO) 10 mg daily.    ? glipiZIDE (GLUCOTROL XL) 5 MG 24 hr tablet TAKE 1 TABLET(5 MG) BY MOUTH IN THE MORNING AND AT BEDTIME 180 tablet 0  ? levothyroxine (SYNTHROID) 50 MCG tablet TAKE 1 TABLET(50 MCG) BY MOUTH DAILY BEFORE BREAKFAST 90 tablet 0  ? lisinopril (ZESTRIL)  30 MG tablet TAKE 1 TABLET(30 MG) BY MOUTH DAILY 90 tablet 3  ? valACYclovir (VALTREX) 1000 MG tablet TK 2 TS PO BID 90 tablet 3  ? Wheat Dextrin (BENEFIBER PO) Take by mouth.    ? ?No current facility-administered medications on file prior to visit.  ? ? ?BP 130/80   Pulse 85   Temp 98.4 ?F (36.9 ?C) (Oral)   Ht '5\' 3"'$  (1.6 m)   Wt 232 lb (105.2 kg)   SpO2 94%   BMI 41.10 kg/m?  ? ? ?   ?Objective:  ? Physical Exam ?Vitals and nursing note reviewed.  ?Constitutional:   ?   Appearance: Normal appearance.  ?Pulmonary:  ?   Effort: Pulmonary effort is normal.  ?   Breath sounds: Normal breath sounds.  ?Musculoskeletal:     ?   General: Tenderness present.  ?   Comments: Throughout left upper back. No spinal tenderness  ?Skin: ?   General: Skin is warm and dry.  ?Neurological:  ?   Mental Status: She is alert.  ? ? ?   ?Assessment & Plan:  ?1. Left flank pain ?- Will get xray to r/o rib fracture but symptoms more consistent with muscular strain  ?- DG Chest 2 View; Future ? ?2. Muscle strain ?- Advised flexeril will make her sleepy.  ?- Continue with heating pad  ?- DG Chest 2 View; Future ?- cyclobenzaprine (FLEXERIL) 10 MG tablet; Take 1 tablet (10 mg total) by mouth 3 (three) times daily as needed for muscle spasms.  Dispense: 30 tablet; Refill: 0 ? ?3. Upper back pain on left side ? ? ?Dorothyann Peng, NP ? ? ? ?

## 2022-02-09 NOTE — Patient Instructions (Signed)
There are no preventive care reminders to display for this patient. ? ? ? Row Labels 11/30/2021  ?  2:25 PM 11/30/2021  ?  2:24 PM 10/28/2021  ?  9:23 AM  ?Depression screen PHQ 2/9   Section Header. No data exists in this row.     ?Decreased Interest   0 0 0  ?Down, Depressed, Hopeless   0 0 0  ?PHQ - 2 Score   0 0 0  ?Altered sleeping   0  0  ?Tired, decreased energy   0  0  ?Change in appetite   0  0  ?Feeling bad or failure about yourself    0  0  ?Trouble concentrating   0  0  ?Moving slowly or fidgety/restless   0  0  ?Suicidal thoughts   0  0  ?PHQ-9 Score   0  0  ?Difficult doing work/chores   Not difficult at all  Not difficult at all  ? ? ?

## 2022-02-18 DIAGNOSIS — M1711 Unilateral primary osteoarthritis, right knee: Secondary | ICD-10-CM | POA: Diagnosis not present

## 2022-03-31 ENCOUNTER — Other Ambulatory Visit: Payer: Self-pay | Admitting: Adult Health

## 2022-04-02 ENCOUNTER — Other Ambulatory Visit: Payer: Self-pay | Admitting: Adult Health

## 2022-04-06 DIAGNOSIS — L570 Actinic keratosis: Secondary | ICD-10-CM | POA: Diagnosis not present

## 2022-04-06 DIAGNOSIS — L821 Other seborrheic keratosis: Secondary | ICD-10-CM | POA: Diagnosis not present

## 2022-04-06 DIAGNOSIS — L820 Inflamed seborrheic keratosis: Secondary | ICD-10-CM | POA: Diagnosis not present

## 2022-04-06 DIAGNOSIS — L814 Other melanin hyperpigmentation: Secondary | ICD-10-CM | POA: Diagnosis not present

## 2022-04-06 DIAGNOSIS — Z85828 Personal history of other malignant neoplasm of skin: Secondary | ICD-10-CM | POA: Diagnosis not present

## 2022-04-06 DIAGNOSIS — D225 Melanocytic nevi of trunk: Secondary | ICD-10-CM | POA: Diagnosis not present

## 2022-04-16 ENCOUNTER — Other Ambulatory Visit: Payer: Self-pay | Admitting: Adult Health

## 2022-04-27 ENCOUNTER — Ambulatory Visit: Payer: Medicare Other | Admitting: Adult Health

## 2022-04-29 ENCOUNTER — Ambulatory Visit (INDEPENDENT_AMBULATORY_CARE_PROVIDER_SITE_OTHER): Payer: Medicare Other | Admitting: Adult Health

## 2022-04-29 ENCOUNTER — Encounter: Payer: Self-pay | Admitting: Adult Health

## 2022-04-29 VITALS — BP 130/82 | HR 99 | Temp 98.2°F | Ht 62.75 in | Wt 235.0 lb

## 2022-04-29 DIAGNOSIS — E039 Hypothyroidism, unspecified: Secondary | ICD-10-CM

## 2022-04-29 DIAGNOSIS — E782 Mixed hyperlipidemia: Secondary | ICD-10-CM

## 2022-04-29 DIAGNOSIS — M810 Age-related osteoporosis without current pathological fracture: Secondary | ICD-10-CM

## 2022-04-29 DIAGNOSIS — I10 Essential (primary) hypertension: Secondary | ICD-10-CM | POA: Diagnosis not present

## 2022-04-29 DIAGNOSIS — E119 Type 2 diabetes mellitus without complications: Secondary | ICD-10-CM

## 2022-04-29 LAB — CBC WITH DIFFERENTIAL/PLATELET
Basophils Absolute: 0.1 10*3/uL (ref 0.0–0.1)
Basophils Relative: 1.1 % (ref 0.0–3.0)
Eosinophils Absolute: 0.2 10*3/uL (ref 0.0–0.7)
Eosinophils Relative: 1.8 % (ref 0.0–5.0)
HCT: 40.7 % (ref 36.0–46.0)
Hemoglobin: 13.4 g/dL (ref 12.0–15.0)
Lymphocytes Relative: 29.7 % (ref 12.0–46.0)
Lymphs Abs: 2.4 10*3/uL (ref 0.7–4.0)
MCHC: 32.8 g/dL (ref 30.0–36.0)
MCV: 95.9 fl (ref 78.0–100.0)
Monocytes Absolute: 0.6 10*3/uL (ref 0.1–1.0)
Monocytes Relative: 6.7 % (ref 3.0–12.0)
Neutro Abs: 5 10*3/uL (ref 1.4–7.7)
Neutrophils Relative %: 60.7 % (ref 43.0–77.0)
Platelets: 345 10*3/uL (ref 150.0–400.0)
RBC: 4.24 Mil/uL (ref 3.87–5.11)
RDW: 13.7 % (ref 11.5–15.5)
WBC: 8.2 10*3/uL (ref 4.0–10.5)

## 2022-04-29 LAB — LIPID PANEL
Cholesterol: 227 mg/dL — ABNORMAL HIGH (ref 0–200)
HDL: 63 mg/dL (ref >39.00)
LDL Cholesterol: 125 mg/dL — ABNORMAL HIGH (ref 0–99)
NonHDL: 164.2
Total CHOL/HDL Ratio: 4
Triglycerides: 194 mg/dL — ABNORMAL HIGH (ref 0.0–149.0)
VLDL: 38.8 mg/dL (ref 0.0–40.0)

## 2022-04-29 LAB — COMPREHENSIVE METABOLIC PANEL
ALT: 13 U/L (ref 0–35)
AST: 16 U/L (ref 0–37)
Albumin: 4.3 g/dL (ref 3.5–5.2)
Alkaline Phosphatase: 84 U/L (ref 39–117)
BUN: 34 mg/dL — ABNORMAL HIGH (ref 6–23)
CO2: 24 mEq/L (ref 19–32)
Calcium: 9.6 mg/dL (ref 8.4–10.5)
Chloride: 106 mEq/L (ref 96–112)
Creatinine, Ser: 0.93 mg/dL (ref 0.40–1.20)
GFR: 59.85 mL/min — ABNORMAL LOW (ref >60.00)
Glucose, Bld: 143 mg/dL — ABNORMAL HIGH (ref 70–99)
Potassium: 4 mEq/L (ref 3.5–5.1)
Sodium: 137 mEq/L (ref 135–145)
Total Bilirubin: 0.4 mg/dL (ref 0.2–1.2)
Total Protein: 7.6 g/dL (ref 6.0–8.3)

## 2022-04-29 LAB — HEMOGLOBIN A1C: Hgb A1c MFr Bld: 7.2 % — ABNORMAL HIGH (ref 4.6–6.5)

## 2022-04-29 LAB — VITAMIN D 25 HYDROXY (VIT D DEFICIENCY, FRACTURES): VITD: 24.23 ng/mL — ABNORMAL LOW (ref 30.00–100.00)

## 2022-04-29 LAB — TSH: TSH: 1.63 u[IU]/mL (ref 0.35–5.50)

## 2022-04-29 NOTE — Progress Notes (Signed)
Subjective:    Patient ID: Caroline Suarez, female    DOB: 07/30/1946, 76 y.o.   MRN: [id number]  HPI Patient presents for yearly preventative medicine examination. She is a pleasant 76 year old female who  has a past medical history of Arthritis, Asthma, Chicken pox, Heart murmur, Hyperlipidemia, Hypertension, Hypothyroidism, Osteoporosis, and Positive TB test.  DM Type 2 - maintained on Glipizide 5 mg ER daily  Lab Results  Component Value Date   HGBA1C 6.1 (A) 10/28/2021   Hyperlipidemia - managed with lipitor 10 mg daily. She denies myalgia or fatigue  Lab Results  Component Value Date   CHOL 161 04/27/2021   HDL 50.30 04/27/2021   LDLCALC 84 04/27/2021   TRIG 134.0 04/27/2021   CHOLHDL 3 04/27/2021   Hypothyroidism - takes synthroid 50 mcg daily  Lab Results  Component Value Date   TSH 3.76 08/31/2021   Osteoporosis -managed with Fosamax 70 mg weekly  Hypertension -well-controlled with lisinopril 30 mg daily.  She denies dizziness, lightheadedness, chest pain, or shortness of breath BP Readings from Last 3 Encounters:  04/29/22 130/82  02/09/22 130/80  10/28/21 100/62    All immunizations and health maintenance protocols were reviewed with the patient and needed orders were placed.  Appropriate screening laboratory values were ordered for the patient including screening of hyperlipidemia, renal function and hepatic function.   Medication reconciliation,  past medical history, social history, problem list and allergies were reviewed in detail with the patient  Goals were established with regard to weight loss, exercise, and  diet in compliance with medications Wt Readings from Last 3 Encounters:  04/29/22 235 lb (106.6 kg)  02/09/22 232 lb (105.2 kg)  11/30/21 220 lb (99.8 kg)   She is seen by GYN who does her mammogram and Dexa scan.   Review of Systems  Constitutional: Negative.   HENT: Negative.    Eyes: Negative.   Respiratory: Negative.     Cardiovascular: Negative.   Gastrointestinal: Negative.   Endocrine: Negative.   Genitourinary: Negative.   Musculoskeletal: Negative.   Skin: Negative.   Allergic/Immunologic: Negative.   Neurological: Negative.   Hematological: Negative.   Psychiatric/Behavioral: Negative.     Past Medical History:  Diagnosis Date   Arthritis    Asthma    Chicken pox    Heart murmur    Hyperlipidemia    Hypertension    Hypothyroidism    Osteoporosis    Positive TB test     Social History   Socioeconomic History   Marital status: Widowed    Spouse name: Not on file   Number of children: Not on file   Years of education: Not on file   Highest education level: Bachelor's degree (e.g., BA, AB, BS)  Occupational History   Not on file  Tobacco Use   Smoking status: Never   Smokeless tobacco: Never  Vaping Use   Vaping Use: Never used  Substance and Sexual Activity   Alcohol use: No   Drug use: No   Sexual activity: Not on file  Other Topics Concern   Not on file  Social History Narrative   Retired Therapist, sports    Social Determinants of Health   Financial Resource Strain: [place]  (04/23/2022)   Overall Financial Resource Strain (CARDIA)    Difficulty of Paying Living Expenses: Not hard at all  Food Insecurity: No Food Insecurity (04/23/2022)   Hunger Vital Sign    Worried About Running Out of Food in the  Last Year: Never true    [place] in the Last Year: Never true  Transportation Needs: No Transportation Needs (04/23/2022)   PRAPARE - Hydrologist (Medical): No    Lack of Transportation (Non-Medical): No  Physical Activity: Insufficiently Active (04/23/2022)   Exercise Vital Sign    Days of Exercise per Week: 3 days    Minutes of Exercise per Session: 30 min  Stress: No Stress Concern Present (04/23/2022)   [place]    Feeling of Stress : Not at all  Social Connections:  Moderately Integrated (04/23/2022)   Social Connection and Isolation Panel [NHANES]    Frequency of Communication with Friends and Family: More than three times a week    Frequency of Social Gatherings with Friends and Family: More than three times a week    Attends Religious Services: 1 to 4 times per year    Active Member of [employer] or Organizations: Yes    Attends Archivist Meetings: More than 4 times per year    Marital Status: Widowed  Intimate Partner Violence: Not At Risk (11/30/2021)   Humiliation, Afraid, Rape, and Kick questionnaire    Fear of Current or Ex-Partner: No    Emotionally Abused: No    Physically Abused: No    Sexually Abused: No    Past Surgical History:  Procedure Laterality Date   BREAST BIOPSY     multiple. None came back positive    FOOT SURGERY Left 2002    Family History  Problem Relation Age of Onset   Heart disease Mother    Stroke Mother    Hyperlipidemia Mother    Pulmonary fibrosis Father     Allergies  Allergen Reactions   Codeine Nausea And Vomiting   Erythromycin Nausea And Vomiting    Current Outpatient Medications on File Prior to Visit  Medication Sig Dispense Refill   albuterol (PROAIR HFA) 108 (90 Base) MCG/ACT inhaler Inhale 1-2 puffs every 6 (six) hours as needed into the lungs. 6.7 g 3   alendronate (FOSAMAX) 70 MG tablet TAKE 1 TABLET(70 MG) BY MOUTH 1 TIME A WEEK WITH A FULL GLASS OF WATER AND ON AN EMPTY STOMACH 12 tablet 1   atorvastatin (LIPITOR) 10 MG tablet TAKE 2 TABLETS BY MOUTH DAILY 180 tablet 1   Cetirizine HCl (ZYRTEC ALLERGY PO) 10 mg daily.     cyclobenzaprine (FLEXERIL) 10 MG tablet Take 1 tablet (10 mg total) by mouth 3 (three) times daily as needed for muscle spasms. 30 tablet 0   glipiZIDE (GLUCOTROL XL) 5 MG 24 hr tablet TAKE 1 TABLET(5 MG) BY MOUTH IN THE MORNING AND AT BEDTIME 180 tablet 0   levothyroxine (SYNTHROID) 50 MCG tablet Take 1 tablet (50 mcg total) by mouth daily before breakfast. 90  tablet 0   lisinopril (ZESTRIL) 30 MG tablet TAKE 1 TABLET(30 MG) BY MOUTH DAILY 90 tablet 3   valACYclovir (VALTREX) 1000 MG tablet TK 2 TS PO BID 90 tablet 3   Wheat Dextrin (BENEFIBER PO) Take by mouth.     No current facility-administered medications on file prior to visit.    BP 130/82   Pulse 99   Temp 98.2 F (36.8 C) (Oral)   Ht 5' 2.75" (1.594 m)   Wt 235 lb (106.6 kg)   SpO2 97%   BMI 41.96 kg/m       Objective:   Physical Exam Vitals and  nursing note reviewed.  Constitutional:      General: She is not in acute distress.    Appearance: Normal appearance. She is well-developed. She is not ill-appearing.  HENT:     Head: Normocephalic and atraumatic.     Right Ear: Tympanic membrane, ear canal and external ear normal. There is no impacted cerumen.     Left Ear: Tympanic membrane, ear canal and external ear normal. There is no impacted cerumen.     Nose: Nose normal. No congestion or rhinorrhea.     Mouth/Throat:     Mouth: Mucous membranes are moist.     Pharynx: Oropharynx is clear. No oropharyngeal exudate or posterior oropharyngeal erythema.  Eyes:     General:        Right eye: No discharge.        Left eye: No discharge.     Extraocular Movements: Extraocular movements intact.     Conjunctiva/sclera: Conjunctivae normal.     Pupils: Pupils are equal, round, and reactive to light.  Neck:     Thyroid: No thyromegaly.     Vascular: No carotid bruit.     Trachea: No tracheal deviation.  Cardiovascular:     Rate and Rhythm: Normal rate and regular rhythm.     Pulses: Normal pulses.     Heart sounds: Normal heart sounds. No murmur heard.    No friction rub. No gallop.  Pulmonary:     Effort: Pulmonary effort is normal. No respiratory distress.     Breath sounds: Normal breath sounds. No stridor. No wheezing, rhonchi or rales.  Chest:     Chest wall: No tenderness.  Abdominal:     General: Abdomen is flat. Bowel sounds are normal. There is no distension.      Palpations: Abdomen is soft. There is no mass.     Tenderness: There is no abdominal tenderness. There is no right CVA tenderness, left CVA tenderness, guarding or rebound.     Hernia: No hernia is present.  Musculoskeletal:        General: No swelling, tenderness, deformity or signs of injury. Normal range of motion.     Cervical back: Normal range of motion and neck supple.     Right lower leg: No edema.     Left lower leg: No edema.  Lymphadenopathy:     Cervical: No cervical adenopathy.  Skin:    General: Skin is warm and dry.     Coloration: Skin is not jaundiced or pale.     Findings: No bruising, erythema, lesion or rash.  Neurological:     General: No focal deficit present.     Mental Status: She is alert and oriented to person, place, and time.     Cranial Nerves: No cranial nerve deficit.     Sensory: No sensory deficit.     Motor: No weakness.     Coordination: Coordination normal.     Gait: Gait normal.     Deep Tendon Reflexes: Reflexes normal.  Psychiatric:        Mood and Affect: Mood normal.        Behavior: Behavior normal.        Thought Content: Thought content normal.        Judgment: Judgment normal.       Assessment & Plan:  1. Type 2 diabetes mellitus without complication, without long-term current use of insulin (HCC) - Consider increase in glipizide - continue to work on weight loss through diet and  exercise - CBC with Differential/Platelet; Future - Comprehensive metabolic panel; Future - Hemoglobin A1c; Future - Lipid panel; Future - TSH; Future  2. Essential hypertension - Well controlled. No change in medications  - CBC with Differential/Platelet; Future - Comprehensive metabolic panel; Future - Hemoglobin A1c; Future - Lipid panel; Future - TSH; Future  3. Hypothyroidism, unspecified type - Consider dose change of synthroid  - CBC with Differential/Platelet; Future - Comprehensive metabolic panel; Future - Hemoglobin A1c;  Future - Lipid panel; Future - TSH; Future  4. Osteoporosis without current pathological fracture, unspecified osteoporosis type - Continue fosamax  - CBC with Differential/Platelet; Future - Comprehensive metabolic panel; Future - Hemoglobin A1c; Future - Lipid panel; Future - TSH; Future - VITAMIN D 25 Hydroxy (Vit-D Deficiency, Fractures); Future  5. Mixed hyperlipidemia - Consider increase in statin  - CBC with Differential/Platelet; Future - Comprehensive metabolic panel; Future - Hemoglobin A1c; Future - Lipid panel; Future - TSH; Future  Dorothyann Peng, NP

## 2022-05-04 ENCOUNTER — Encounter: Payer: Self-pay | Admitting: Adult Health

## 2022-05-06 NOTE — Telephone Encounter (Signed)
This was taking care of. No further actions needed.

## 2022-05-17 ENCOUNTER — Telehealth: Payer: Medicare Other

## 2022-05-18 DIAGNOSIS — Z6841 Body Mass Index (BMI) 40.0 and over, adult: Secondary | ICD-10-CM | POA: Diagnosis not present

## 2022-05-18 DIAGNOSIS — Z1231 Encounter for screening mammogram for malignant neoplasm of breast: Secondary | ICD-10-CM | POA: Diagnosis not present

## 2022-05-18 DIAGNOSIS — Z1151 Encounter for screening for human papillomavirus (HPV): Secondary | ICD-10-CM | POA: Diagnosis not present

## 2022-05-18 DIAGNOSIS — Z124 Encounter for screening for malignant neoplasm of cervix: Secondary | ICD-10-CM | POA: Diagnosis not present

## 2022-05-23 ENCOUNTER — Other Ambulatory Visit: Payer: Self-pay | Admitting: Obstetrics and Gynecology

## 2022-05-23 DIAGNOSIS — R928 Other abnormal and inconclusive findings on diagnostic imaging of breast: Secondary | ICD-10-CM

## 2022-06-03 ENCOUNTER — Ambulatory Visit
Admission: RE | Admit: 2022-06-03 | Discharge: 2022-06-03 | Disposition: A | Discharge status: Home / Self Care | Payer: Medicare Other | Source: Ambulatory Visit | Attending: Obstetrics and Gynecology | Admitting: Obstetrics and Gynecology

## 2022-06-03 ENCOUNTER — Other Ambulatory Visit: Payer: Self-pay | Admitting: Obstetrics and Gynecology

## 2022-06-03 DIAGNOSIS — N6012 Diffuse cystic mastopathy of left breast: Secondary | ICD-10-CM | POA: Diagnosis not present

## 2022-06-03 DIAGNOSIS — R928 Other abnormal and inconclusive findings on diagnostic imaging of breast: Secondary | ICD-10-CM | POA: Diagnosis not present

## 2022-06-03 DIAGNOSIS — N6320 Unspecified lump in the left breast, unspecified quadrant: Secondary | ICD-10-CM

## 2022-06-09 ENCOUNTER — Encounter: Payer: Self-pay | Admitting: Adult Health

## 2022-06-19 ENCOUNTER — Other Ambulatory Visit: Payer: Self-pay | Admitting: Adult Health

## 2022-06-19 DIAGNOSIS — I10 Essential (primary) hypertension: Secondary | ICD-10-CM

## 2022-06-20 ENCOUNTER — Encounter: Payer: Self-pay | Admitting: Adult Health

## 2022-06-22 ENCOUNTER — Other Ambulatory Visit: Payer: Self-pay | Admitting: Adult Health

## 2022-07-13 ENCOUNTER — Other Ambulatory Visit: Payer: Self-pay | Admitting: Adult Health

## 2022-08-02 ENCOUNTER — Ambulatory Visit (INDEPENDENT_AMBULATORY_CARE_PROVIDER_SITE_OTHER): Payer: Medicare Other | Admitting: Adult Health

## 2022-08-02 ENCOUNTER — Encounter: Payer: Self-pay | Admitting: Adult Health

## 2022-08-02 VITALS — BP 130/80 | HR 95 | Temp 98.0°F | Ht 62.75 in | Wt 233.0 lb

## 2022-08-02 DIAGNOSIS — E119 Type 2 diabetes mellitus without complications: Secondary | ICD-10-CM

## 2022-08-02 DIAGNOSIS — Z760 Encounter for issue of repeat prescription: Secondary | ICD-10-CM | POA: Diagnosis not present

## 2022-08-02 DIAGNOSIS — I10 Essential (primary) hypertension: Secondary | ICD-10-CM

## 2022-08-02 LAB — POCT GLYCOSYLATED HEMOGLOBIN (HGB A1C): Hemoglobin A1C: 6.5 % — AB (ref 4.0–5.6)

## 2022-08-02 MED ORDER — ALBUTEROL SULFATE HFA 108 (90 BASE) MCG/ACT IN AERS: 1.0000 | INHALATION_SPRAY | Freq: Four times a day (QID) | RESPIRATORY_TRACT | 3 refills | Status: AC | PRN

## 2022-08-02 NOTE — Progress Notes (Unsigned)
Subjective:    Patient ID: Caroline Suarez, female    DOB: 01/03/1946, 76 y.o.   MRN: [id number]  HPI 76 year old female who  has a past medical history of Arthritis, Asthma, Chicken pox, Heart murmur, Hyperlipidemia, Hypertension, Hypothyroidism, Osteoporosis, and Positive TB test.  She presents to the office today for 64-monthfollow-up regarding diabetes and hypertension  Diabetes Mellitus -currently managed with glipizide 5 mg extended release twice daily, at this time her A1c had increased from baseline 6.1-7.2. She has been working on reducing carbs and and is having a smoothie before going to bed. Her blood sugars have been very well controlled with fasting glucose levels in the 100's .  Wt Readings from Last 3 Encounters:  08/02/22 233 lb (105.7 kg)  04/29/22 235 lb (106.6 kg)  02/09/22 232 lb (105.2 kg)   Hypertension-well-controlled with lisinopril 30 mg daily.  She denies dizziness, lightheadedness, chest pain, or shortness of breath BP Readings from Last 3 Encounters:  08/02/22 130/80  04/29/22 130/82  02/09/22 130/80   She needs albuterol refill; she uses periodically during allergy season. Last refill was in 2018   Review of Systems See HPI   Past Medical History:  Diagnosis Date   Arthritis    Asthma    Chicken pox    Heart murmur    Hyperlipidemia    Hypertension    Hypothyroidism    Osteoporosis    Positive TB test     Social History   Socioeconomic History   Marital status: Widowed    Spouse name: Not on file   Number of children: Not on file   Years of education: Not on file   Highest education level: Bachelor's degree (e.g., BA, AB, BS)  Occupational History   Not on file  Tobacco Use   Smoking status: Never   Smokeless tobacco: Never  Vaping Use   Vaping Use: Never used  Substance and Sexual Activity   Alcohol use: No   Drug use: No   Sexual activity: Not on file  Other Topics Concern   Not on file  Social History Narrative   Retired RTherapist, sports    Social Determinants of Health   Financial Resource Strain: [place] (04/23/2022)   Overall Financial Resource Strain (CARDIA)    Difficulty of Paying Living Expenses: Not hard at all  Food Insecurity: No Food Insecurity (04/23/2022)   Hunger Vital Sign    Worried About Running Out of Food in the Last Year: Never true    [place] the Last Year: Never true  Transportation Needs: No Transportation Needs (04/23/2022)   PRAPARE - THydrologist(Medical): No    Lack of Transportation (Non-Medical): No  Physical Activity: Insufficiently Active (04/23/2022)   Exercise Vital Sign    Days of Exercise per Week: 3 days    Minutes of Exercise per Session: 30 min  Stress: No Stress Concern Present (04/23/2022)   [place]   Feeling of Stress : Not at all  Social Connections: Moderately Integrated (04/23/2022)   Social Connection and Isolation Panel [NHANES]    Frequency of Communication with Friends and Family: More than three times a week    Frequency of Social Gatherings with Friends and Family: More than three times a week    Attends Religious Services: 1 to 4 times per year    Active Member of [employer] Organizations:  Yes    Attends Club or Organization Meetings: More than 4 times per year    Marital Status: Widowed  Intimate Partner Violence: Not At Risk (11/30/2021)   Humiliation, Afraid, Rape, and Kick questionnaire    Fear of Current or Ex-Partner: No    Emotionally Abused: No    Physically Abused: No    Sexually Abused: No    Past Surgical History:  Procedure Laterality Date   BREAST BIOPSY     multiple. None came back positive    FOOT SURGERY Left 2002    Family History  Problem Relation Age of Onset   Heart disease Mother    Stroke Mother    Hyperlipidemia Mother    Pulmonary fibrosis Father     Allergies  Allergen Reactions   Codeine Nausea And Vomiting    Erythromycin Nausea And Vomiting    Current Outpatient Medications on File Prior to Visit  Medication Sig Dispense Refill   albuterol (PROAIR HFA) 108 (90 Base) MCG/ACT inhaler Inhale 1-2 puffs every 6 (six) hours as needed into the lungs. 6.7 g 3   alendronate (FOSAMAX) 70 MG tablet TAKE 1 TABLET(70 MG) BY MOUTH 1 TIME A WEEK WITH A FULL GLASS OF WATER AND ON AN EMPTY STOMACH 12 tablet 1   atorvastatin (LIPITOR) 10 MG tablet TAKE 2 TABLETS BY MOUTH DAILY (Patient taking differently: Take 10 mg by mouth daily.) 180 tablet 1   Cetirizine HCl (ZYRTEC ALLERGY PO) 10 mg daily.     cyclobenzaprine (FLEXERIL) 10 MG tablet Take 1 tablet (10 mg total) by mouth 3 (three) times daily as needed for muscle spasms. 30 tablet 0   glipiZIDE (GLUCOTROL XL) 5 MG 24 hr tablet TAKE 1 TABLET(5 MG) BY MOUTH IN THE MORNING AND AT BEDTIME 180 tablet 0   levothyroxine (SYNTHROID) 50 MCG tablet TAKE 1 TABLET(50 MCG) BY MOUTH DAILY BEFORE BREAKFAST 90 tablet 0   lisinopril (ZESTRIL) 30 MG tablet TAKE 1 TABLET(30 MG) BY MOUTH DAILY 90 tablet 3   valACYclovir (VALTREX) 1000 MG tablet TAKE 2 TABLETS BY MOUTH TWICE DAILY 90 tablet 3   Wheat Dextrin (BENEFIBER PO) Take by mouth.     No current facility-administered medications on file prior to visit.    BP 130/80   Pulse 95   Temp 98 F (36.7 C) (Oral)   Ht 5' 2.75" (1.594 m)   Wt 233 lb (105.7 kg)   SpO2 94%   BMI 41.60 kg/m       Objective:   Physical Exam Vitals and nursing note reviewed.  Constitutional:      Appearance: Normal appearance.  Cardiovascular:     Rate and Rhythm: Normal rate and regular rhythm.     Pulses: Normal pulses.     Heart sounds: Normal heart sounds.  Pulmonary:     Effort: Pulmonary effort is normal.     Breath sounds: Normal breath sounds.  Neurological:     General: No focal deficit present.     Mental Status: She is alert and oriented to person, place, and time.  Psychiatric:        Mood and Affect: Mood normal.         Behavior: Behavior normal.        Thought Content: Thought content normal.        Judgment: Judgment normal.       Assessment & Plan:  1. Type 2 diabetes mellitus without complication, without long-term current use of insulin (HCC)  - POC  HgB A1c- 6.5  - Controlled. Continue with glipizide 5 mg ER BID - Follow up in August for CPE   2. Medication refill  - albuterol (PROAIR HFA) 108 (90 Base) MCG/ACT inhaler; Inhale 1-2 puffs into the lungs every 6 (six) hours as needed.  Dispense: 6.7 g; Refill: 3  3. Essential hypertension - well controlled.  - No change in medications   Dorothyann Peng, NP

## 2022-08-02 NOTE — Patient Instructions (Signed)
It was great seeing you today   Your A1c dropped to 6.5!  I will see you back in August for your physical exam

## 2022-08-06 ENCOUNTER — Other Ambulatory Visit: Payer: Self-pay | Admitting: Adult Health

## 2022-08-31 DIAGNOSIS — M1711 Unilateral primary osteoarthritis, right knee: Secondary | ICD-10-CM | POA: Diagnosis not present

## 2022-09-12 ENCOUNTER — Other Ambulatory Visit: Payer: Self-pay | Admitting: Adult Health

## 2022-09-13 ENCOUNTER — Other Ambulatory Visit: Payer: Self-pay | Admitting: Adult Health

## 2022-10-08 ENCOUNTER — Other Ambulatory Visit: Payer: Self-pay | Admitting: Adult Health

## 2022-10-19 DIAGNOSIS — H5203 Hypermetropia, bilateral: Secondary | ICD-10-CM | POA: Diagnosis not present

## 2022-10-19 DIAGNOSIS — E119 Type 2 diabetes mellitus without complications: Secondary | ICD-10-CM | POA: Diagnosis not present

## 2022-10-19 DIAGNOSIS — Z961 Presence of intraocular lens: Secondary | ICD-10-CM | POA: Diagnosis not present

## 2022-10-19 LAB — HM DIABETES EYE EXAM

## 2022-10-21 DIAGNOSIS — M1711 Unilateral primary osteoarthritis, right knee: Secondary | ICD-10-CM | POA: Diagnosis not present

## 2022-11-01 ENCOUNTER — Ambulatory Visit: Payer: Medicare Other | Admitting: Adult Health

## 2022-11-14 ENCOUNTER — Encounter: Payer: Self-pay | Admitting: Adult Health

## 2022-11-15 ENCOUNTER — Other Ambulatory Visit: Payer: Self-pay | Admitting: Adult Health

## 2022-11-15 MED ORDER — ATORVASTATIN CALCIUM 20 MG PO TABS: 20.0000 mg | ORAL_TABLET | Freq: Every day | ORAL | 3 refills | Status: DC

## 2022-11-15 NOTE — Telephone Encounter (Signed)
Please advise 

## 2022-12-02 ENCOUNTER — Ambulatory Visit (INDEPENDENT_AMBULATORY_CARE_PROVIDER_SITE_OTHER): Payer: Medicare Other

## 2022-12-02 VITALS — Ht 62.75 in | Wt 233.0 lb

## 2022-12-02 DIAGNOSIS — Z0000 Encounter for general adult medical examination without abnormal findings: Secondary | ICD-10-CM

## 2022-12-02 NOTE — Patient Instructions (Addendum)
[name] , Thank you for taking time to come for your Medicare Wellness Visit. I appreciate your ongoing commitment to your health goals. Please review the following plan we discussed and let me know if I can assist you in the future.   These are the goals we discussed:  Goals       Exercise 150 min/wk Moderate Activity      Join water aerobics class !       Lose weight (pt-stated)      I want my balance to improve.      Patient Stated (pt-stated)      I will continue to do water aerobics for 1 hour 5 days a week.      Track and Manage My Blood Pressure-Hypertension      Timeframe:  Long-Range Goal Priority:  Medium Start Date:                             Expected End Date:                       Follow Up Date 12/18/21    - check blood pressure weekly - choose a place to take my blood pressure (home, clinic or office, retail store) - write blood pressure results in a log or diary    Why is this important?   You won't feel high blood pressure, but it can still hurt your blood vessels.  High blood pressure can cause heart or kidney problems. It can also cause a stroke.  Making lifestyle changes like losing a little weight or eating less salt will help.  Checking your blood pressure at home and at different times of the day can help to control blood pressure.  If the doctor prescribes medicine remember to take it the way the doctor ordered.  Call the office if you cannot afford the medicine or if there are questions about it.     Notes:         This is a list of the screening recommended for you and due dates:  Health Maintenance  Topic Date Due   DTaP/Tdap/Td vaccine (2 - Tdap) 10/12/2022   Yearly kidney health urinalysis for diabetes  12/03/2022*   COVID-19 Vaccine (8 - 2023-24 season) 12/18/2022*   Hemoglobin A1C  01/31/2023   Yearly kidney function blood test for diabetes  04/30/2023   Complete foot exam   04/30/2023   Eye exam for diabetics  10/20/2023   Medicare  Annual Wellness Visit  12/02/2023   Pneumonia Vaccine  Completed   Flu Shot  Completed   DEXA scan (bone density measurement)  Completed   Hepatitis C Screening: USPSTF Recommendation to screen - Ages 65-79 yo.  Completed   Zoster (Shingles) Vaccine  Completed   HPV Vaccine  Aged Out   Colon Cancer Screening  Discontinued  *Topic was postponed. The date shown is not the original due date.    Advanced directives: In Chart  Conditions/risks identified: None  Next appointment: Follow up in one year for your annual wellness visit    Preventive Care 65 Years and Older, Female Preventive care refers to lifestyle choices and visits with your health care provider that can promote health and wellness. What does preventive care include? A yearly physical exam. This is also called an annual well check. Dental exams once or twice a year. Routine eye exams. Ask your health care provider how  often you should have your eyes checked. Personal lifestyle choices, including: Daily care of your teeth and gums. Regular physical activity. Eating a healthy diet. Avoiding tobacco and drug use. Limiting alcohol use. Practicing safe sex. Taking low-dose aspirin every day. Taking vitamin and mineral supplements as recommended by your health care provider. What happens during an annual well check? The services and screenings done by your health care provider during your annual well check will depend on your age, overall health, lifestyle risk factors, and family history of disease. Counseling  Your health care provider may ask you questions about your: Alcohol use. Tobacco use. Drug use. Emotional well-being. Home and relationship well-being. Sexual activity. Eating habits. History of falls. Memory and ability to understand (cognition). Work and work Statistician. Reproductive health. Screening  You may have the following tests or measurements: Height, weight, and BMI. Blood pressure. Lipid  and cholesterol levels. These may be checked every 5 years, or more frequently if you are over 36 years old. Skin check. Lung cancer screening. You may have this screening every year starting at age 65 if you have a 30-pack-year history of smoking and currently smoke or have quit within the past 15 years. Fecal occult blood test (FOBT) of the stool. You may have this test every year starting at age 30. Flexible sigmoidoscopy or colonoscopy. You may have a sigmoidoscopy every 5 years or a colonoscopy every 10 years starting at age 32. Hepatitis C blood test. Hepatitis B blood test. Sexually transmitted disease (STD) testing. Diabetes screening. This is done by checking your blood sugar (glucose) after you have not eaten for a while (fasting). You may have this done every 1-3 years. Bone density scan. This is done to screen for osteoporosis. You may have this done starting at age 85. Mammogram. This may be done every 1-2 years. Talk to your health care provider about how often you should have regular mammograms. Talk with your health care provider about your test results, treatment options, and if necessary, the need for more tests. Vaccines  Your health care provider may recommend certain vaccines, such as: Influenza vaccine. This is recommended every year. Tetanus, diphtheria, and acellular pertussis (Tdap, Td) vaccine. You may need a Td booster every 10 years. Zoster vaccine. You may need this after age 16. Pneumococcal 13-valent conjugate (PCV13) vaccine. One dose is recommended after age 19. Pneumococcal polysaccharide (PPSV23) vaccine. One dose is recommended after age 19. Talk to your health care provider about which screenings and vaccines you need and how often you need them. This information is not intended to replace advice given to you by your health care provider. Make sure you discuss any questions you have with your health care provider. Document Released: 10/09/2015 Document  Revised: 06/01/2016 Document Reviewed: 07/14/2015 Elsevier Interactive Patient Education  2017 [place] Prevention in the Home Falls can cause injuries. They can happen to people of all ages. There are many things you can do to make your home safe and to help prevent falls. What can I do on the outside of my home? Regularly fix the edges of walkways and driveways and fix any cracks. Remove anything that might make you trip as you walk through a door, such as a raised step or threshold. Trim any bushes or trees on the path to your home. Use bright outdoor lighting. Clear any walking paths of anything that might make someone trip, such as rocks or tools. Regularly check to see if handrails are loose or  broken. Make sure that both sides of any steps have handrails. Any raised decks and porches should have guardrails on the edges. Have any leaves, snow, or ice cleared regularly. Use sand or salt on walking paths during winter. Clean up any spills in your garage right away. This includes oil or grease spills. What can I do in the bathroom? Use night lights. Install grab bars by the toilet and in the tub and shower. Do not use towel bars as grab bars. Use non-skid mats or decals in the tub or shower. If you need to sit down in the shower, use a plastic, non-slip stool. Keep the floor dry. Clean up any water that spills on the floor as soon as it happens. Remove soap buildup in the tub or shower regularly. Attach bath mats securely with double-sided non-slip rug tape. Do not have throw rugs and other things on the floor that can make you trip. What can I do in the bedroom? Use night lights. Make sure that you have a light by your bed that is easy to reach. Do not use any sheets or blankets that are too big for your bed. They should not hang down onto the floor. Have a firm chair that has side arms. You can use this for support while you get dressed. Do not have throw rugs and other  things on the floor that can make you trip. What can I do in the kitchen? Clean up any spills right away. Avoid walking on wet floors. Keep items that you use a lot in easy-to-reach places. If you need to reach something above you, use a strong step stool that has a grab bar. Keep electrical cords out of the way. Do not use floor polish or wax that makes floors slippery. If you must use wax, use non-skid floor wax. Do not have throw rugs and other things on the floor that can make you trip. What can I do with my stairs? Do not leave any items on the stairs. Make sure that there are handrails on both sides of the stairs and use them. Fix handrails that are broken or loose. Make sure that handrails are as long as the stairways. Check any carpeting to make sure that it is firmly attached to the stairs. Fix any carpet that is loose or worn. Avoid having throw rugs at the top or bottom of the stairs. If you do have throw rugs, attach them to the floor with carpet tape. Make sure that you have a light switch at the top of the stairs and the bottom of the stairs. If you do not have them, ask someone to add them for you. What else can I do to help prevent falls? Wear shoes that: Do not have high heels. Have rubber bottoms. Are comfortable and fit you well. Are closed at the toe. Do not wear sandals. If you use a stepladder: Make sure that it is fully opened. Do not climb a closed stepladder. Make sure that both sides of the stepladder are locked into place. Ask someone to hold it for you, if possible. Clearly mark and make sure that you can see: Any grab bars or handrails. First and last steps. Where the edge of each step is. Use tools that help you move around (mobility aids) if they are needed. These include: Canes. Walkers. Scooters. Crutches. Turn on the lights when you go into a dark area. Replace any light bulbs as soon as they burn out. Set up  your furniture so you have a clear  path. Avoid moving your furniture around. If any of your floors are uneven, fix them. If there are any pets around you, be aware of where they are. Review your medicines with your doctor. Some medicines can make you feel dizzy. This can increase your chance of falling. Ask your doctor what other things that you can do to help prevent falls. This information is not intended to replace advice given to you by your health care provider. Make sure you discuss any questions you have with your health care provider. Document Released: 07/09/2009 Document Revised: 02/18/2016 Document Reviewed: 10/17/2014 Elsevier Interactive Patient Education  2017 [employer].

## 2022-12-02 NOTE — Progress Notes (Signed)
Subjective:   Caroline Suarez is a 77 y.o. female who presents for Medicare Annual (Subsequent) preventive examination.  Review of Systems    Virtual Visit via Telephone Note  I connected with  Caroline Suarez on 12/02/22 at  2:00 PM EST by telephone and verified that I am speaking with the correct person using two identifiers.  Location: Patient: Home Provider: Office Persons participating in the virtual visit: patient/Nurse Health Advisor   I discussed the limitations, risks, security and privacy concerns of performing an evaluation and management service by telephone and the availability of in person appointments. The patient expressed understanding and agreed to proceed.  Interactive audio and video telecommunications were attempted between this nurse and patient, however failed, due to patient having technical difficulties OR patient did not have access to video capability.  We continued and completed visit with audio only.  Some vital signs may be absent or patient reported.   Criselda Peaches, LPN  Cardiac Risk Factors include: advanced age (>61mn, >>42women);diabetes mellitus;hypertension     Objective:    Today's Vitals   12/02/22 1406  Weight: 233 lb (105.7 kg)  Height: 5' 2.75" (1.594 m)   Body mass index is 41.6 kg/m.     12/02/2022    2:18 PM 11/30/2021    2:29 PM 05/20/2020    9:08 AM 04/03/2018    1:51 PM  Advanced Directives  Does Patient Have a Medical Advance Directive? Yes Yes Yes Yes  Type of AParamedicof AJohn SevierLiving will HP[place]Living will H[place]royoLiving will   Does patient want to make changes to medical advance directive? No - Patient declined No - Patient declined No - Patient declined   Copy of [place] Chart? Yes - validated most recent copy scanned in chart (See row information) No - copy requested Yes - validated most recent copy scanned in chart (See row  information)     Current Medications (verified) Outpatient Encounter Medications as of 12/02/2022  Medication Sig   albuterol (PROAIR HFA) 108 (90 Base) MCG/ACT inhaler Inhale 1-2 puffs into the lungs every 6 (six) hours as needed.   alendronate (FOSAMAX) 70 MG tablet TAKE 1 TABLET(70 MG) BY MOUTH 1 TIME A WEEK WITH A FULL GLASS OF WATER AND ON AN EMPTY STOMACH   atorvastatin (LIPITOR) 20 MG tablet Take 1 tablet (20 mg total) by mouth daily.   Cetirizine HCl (ZYRTEC ALLERGY PO) 10 mg daily.   glipiZIDE (GLUCOTROL XL) 5 MG 24 hr tablet TAKE 1 TABLET(5 MG) BY MOUTH IN THE MORNING AND AT BEDTIME   levothyroxine (SYNTHROID) 50 MCG tablet TAKE 1 TABLET(50 MCG) BY MOUTH DAILY BEFORE BREAKFAST   lisinopril (ZESTRIL) 30 MG tablet TAKE 1 TABLET(30 MG) BY MOUTH DAILY   valACYclovir (VALTREX) 1000 MG tablet TAKE 2 TABLETS BY MOUTH TWICE DAILY   Wheat Dextrin (BENEFIBER PO) Take by mouth.   [DISCONTINUED] cyclobenzaprine (FLEXERIL) 10 MG tablet Take 1 tablet (10 mg total) by mouth 3 (three) times daily as needed for muscle spasms.   No facility-administered encounter medications on file as of 12/02/2022.    Allergies (verified) Codeine and Erythromycin   History: Past Medical History:  Diagnosis Date   Arthritis    Asthma    Chicken pox    Heart murmur    Hyperlipidemia    Hypertension    Hypothyroidism    Osteoporosis    Positive TB test  Past Surgical History:  Procedure Laterality Date   BREAST BIOPSY     multiple. None came back positive    FOOT SURGERY Left 2002   Family History  Problem Relation Age of Onset   Heart disease Mother    Stroke Mother    Hyperlipidemia Mother    Pulmonary fibrosis Father    Social History   Socioeconomic History   Marital status: Widowed    Spouse name: Not on file   Number of children: Not on file   Years of education: Not on file   Highest education level: Bachelor's degree (e.g., BA, AB, BS)  Occupational History   Not on file   Tobacco Use   Smoking status: Never   Smokeless tobacco: Never  Vaping Use   Vaping Use: Never used  Substance and Sexual Activity   Alcohol use: No   Drug use: No   Sexual activity: Not on file  Other Topics Concern   Not on file  Social History Narrative   Retired Therapist, sports    Social Determinants of Health   Financial Resource Strain: [place]  (12/02/2022)   Overall Financial Resource Strain (CARDIA)    Difficulty of Paying Living Expenses: Not hard at all  Food Insecurity: No Food Insecurity (12/02/2022)   Hunger Vital Sign    Worried About Running Out of Food in the Last Year: Never true    [place] in the Last Year: Never true  Transportation Needs: No Transportation Needs (12/02/2022)   PRAPARE - Hydrologist (Medical): No    Lack of Transportation (Non-Medical): No  Physical Activity: Sufficiently Active (12/02/2022)   Exercise Vital Sign    Days of Exercise per Week: 4 days    Minutes of Exercise per Session: 40 min  Stress: No Stress Concern Present (12/02/2022)   [place]    Feeling of Stress : Not at all  Social Connections: Moderately Integrated (12/02/2022)   Social Connection and Isolation Panel [NHANES]    Frequency of Communication with Friends and Family: More than three times a week    Frequency of Social Gatherings with Friends and Family: More than three times a week    Attends Religious Services: More than 4 times per year    Active Member of [employer] or Organizations: Yes    Attends Archivist Meetings: More than 4 times per year    Marital Status: Widowed    Tobacco Counseling Counseling given: Not Answered   Clinical Intake:  Pre-visit preparation completed: Yes  Pain : No/denies pain   Nutrition Risk Assessment:  Has the patient had any N/V/D within the last 2 months?  No  Does the patient have any non-healing wounds?  No  Has the patient had  any unintentional weight loss or weight gain?  No   Diabetes:  Is the patient diabetic?  Yes  If diabetic, was a CBG obtained today?  Yes CBG 126 Taken by patient Did the patient bring in their glucometer from home?  No  How often do you monitor your CBG's? Daily.   Financial Strains and Diabetes Management:  Are you having any financial strains with the device, your supplies or your medication? No .  Does the patient want to be seen by Chronic Care Management for management of their diabetes?  No  Would the patient like to be referred to a Nutritionist or for Diabetic Management?  No  Diabetic Exams:  Diabetic Eye Exam: Completed No. Overdue for diabetic eye exam. Pt has been advised about the importance in completing this exam. A referral has been placed today. Message sent to referral coordinator for scheduling purposes. Advised pt to expect a call from office referred to regarding appt.  Diabetic Foot Exam: Completed No. Pt has been advised about the importance in completing this exam. Pt is scheduled for diabetic foot exam on Followed by PCP.    BMI - recorded: 41.6 Nutritional Status: BMI > 30  Obese Nutritional Risks: None Diabetes: Yes CBG done?: Yes (CBG 126 Taken by patient) CBG resulted in Enter/ Edit results?: Yes Did pt. bring in CBG monitor from home?: No  How often do you need to have someone help you when you read instructions, pamphlets, or other written materials from your doctor or pharmacy?: 1 - Never  Diabetic?  Yes  Interpreter Needed?: No  Information entered by :: Rolene Arbour LPN   Activities of Daily Living    12/02/2022    2:13 PM 11/28/2022    8:45 AM  In your present state of health, do you have any difficulty performing the following activities:  Hearing? 0 0  Vision? 0 0  Difficulty concentrating or making decisions? 0 0  Walking or climbing stairs? 1 1  Comment Uses a cane. Followed by Orthopedic   Dressing or bathing? 0 0  Doing  errands, shopping? 0 0  Preparing Food and eating ? N N  Using the Toilet? N N  In the past six months, have you accidently leaked urine? Tempie Donning  Comment Wears pads. Followed by PCP   Do you have problems with loss of bowel control? N N  Managing your Medications? N N  Managing your Finances? N N  Housekeeping or managing your Housekeeping? N N    Patient Care Team: Dorothyann Peng, NP as PCP - General (Family Medicine) Viona Gilmore, [facility] (Inactive) as Pharmacist (Pharmacist)  Indicate any recent Medical Services you may have received from other than [facility] providers in the past year (date may be approximate).     Assessment:   This is a routine wellness examination for Breyana.  Hearing/Vision screen Hearing Screening - Comments:: Denies hearing difficulties   Vision Screening - Comments:: Wears rx glasses - up to date with routine eye exams with  Dr Celene Squibb  Dietary issues and exercise activities discussed: Exercise limited by: orthopedic condition(s)   Goals Addressed               This Visit's Progress     Lose weight (pt-stated)        I want my balance to improve.       Depression Screen    12/02/2022    2:12 PM 02/09/2022   10:36 AM 11/30/2021    2:25 PM 11/30/2021    2:24 PM 10/28/2021    9:23 AM 04/27/2021    9:01 AM 05/20/2020    9:10 AM  PHQ 2/9 Scores  PHQ - 2 Score 0 0 0 0 0 0 0  PHQ- 9 Score   0  0  0    Fall Risk    12/02/2022    2:15 PM 11/28/2022    8:45 AM 04/23/2022   11:03 AM 02/09/2022   10:35 AM 11/30/2021    2:29 PM  [place] in the past year? '1 1 1 1 '$ 0  Number falls in past yr: 0 1 1 0 0  Injury with Fall? '1 1 1 1 '$ 0  Comment Injury to rt knee. Followed by medical attention/Orthopedic      Risk for fall due to : Impaired balance/gait   History of fall(s) No Fall Risks  Follow up Falls prevention discussed   Falls evaluation completed     FALL RISK PREVENTION PERTAINING TO THE HOME:  Any stairs in or around the home? Yes If so, are  there any without handrails? No  Home free of loose throw rugs in walkways, pet beds, electrical cords, etc? Yes  Adequate lighting in your home to reduce risk of falls? Yes   ASSISTIVE DEVICES UTILIZED TO PREVENT FALLS:  Life alert? Yes  Use of a cane, walker or w/c? Yes  Grab bars in the bathroom? Yes  Shower chair or bench in shower? Yes  Elevated toilet seat or a handicapped toilet? Yes  TIMED UP AND GO:  Was the test performed? No . Audio Visit   Cognitive Function:        12/02/2022    2:19 PM 11/30/2021    2:30 PM 05/20/2020    9:13 AM  6CIT Screen  What Year? 0 points 0 points 0 points  What month? 0 points 0 points 0 points  What time? 0 points 0 points 0 points  Count back from 20 0 points 0 points 0 points  Months in reverse 0 points 0 points 0 points  Repeat phrase 0 points 0 points 0 points  Total Score 0 points 0 points 0 points    Immunizations Immunization History  Administered Date(s) Administered   Fluad Quad(high Dose 65+) 05/20/2019, 07/24/2020, 06/13/2021   Hepatitis B 03/23/1984   Influenza, High Dose Seasonal PF 06/14/2017, 07/04/2018, 07/04/2018   Influenza-Unspecified 06/25/2016, 06/04/2022   PFIZER Comirnaty(Gray Top)Covid-19 Tri-Sucrose Vaccine 02/04/2022   PFIZER(Purple Top)SARS-COV-2 Vaccination 11/09/2019, 12/02/2019, 06/20/2020, 01/05/2021, 06/10/2022   Pfizer Covid-19 Vaccine Bivalent Booster 51yr & up 06/13/2021   Pneumococcal Conjugate-13 10/16/2013   Pneumococcal Polysaccharide-23 06/28/2009, 04/03/2018   Rsv, Bivalent, Protein Subunit Rsvpref,pf (Evans Lance 06/20/2022   Td 10/12/2012   Zoster Recombinat (Shingrix) 02/09/2017, 04/12/2017    TDAP status: Due, Education has been provided regarding the importance of this vaccine. Advised may receive this vaccine at local pharmacy or Health Dept. Aware to provide a copy of the vaccination record if obtained from local pharmacy or Health Dept. Verbalized acceptance and understanding.  Flu  Vaccine status: Up to date  Pneumococcal vaccine status: Up to date  Covid-19 vaccine status: Completed vaccines  Qualifies for Shingles Vaccine? Yes   Zostavax completed Yes   Shingrix Completed?: Yes  Screening Tests Health Maintenance  Topic Date Due   DTaP/Tdap/Td (2 - Tdap) 10/12/2022   Diabetic kidney evaluation - Urine ACR  12/03/2022 (Originally 07/24/2021)   COVID-19 Vaccine (8 - 2023-24 season) 12/18/2022 (Originally 08/05/2022)   HEMOGLOBIN A1C  01/31/2023   Diabetic kidney evaluation - eGFR measurement  04/30/2023   FOOT EXAM  04/30/2023   OPHTHALMOLOGY EXAM  10/20/2023   Medicare Annual Wellness (AWV)  12/02/2023   Pneumonia Vaccine 77 Years old  Completed   INFLUENZA VACCINE  Completed   DEXA SCAN  Completed   Hepatitis C Screening  Completed   Zoster Vaccines- Shingrix  Completed   HPV VACCINES  Aged Out   COLONOSCOPY (Pts [phone] coverage will need to be confirmed)  Discontinued    Health Maintenance  Health Maintenance Due  Topic Date Due   DTaP/Tdap/Td (2 - Tdap) 10/12/2022  Colorectal cancer screening: No longer required.   Mammogram status: No longer required due to Age.  Bone Density status: Completed 05/17/21. Results reflect: Bone density results: OSTEOPOROSIS. Repeat every   years.  Lung Cancer Screening: (Low Dose CT Chest recommended if Age 60-80 years, 30 pack-year currently smoking OR have quit w/in 15years.) does not qualify.     Additional Screening:  Hepatitis C Screening: does qualify; Completed 04/03/18  Vision Screening: Recommended annual ophthalmology exams for early detection of glaucoma and other disorders of the eye. Is the patient up to date with their annual eye exam?  Yes  Who is the provider or what is the name of the office in which the patient attends annual eye exams? Dr Celene Squibb If pt is not established with a provider, would they like to be referred to a provider to establish care? No .   Dental  Screening: Recommended annual dental exams for proper oral hygiene  Community Resource Referral / Chronic Care Management:  CRR required this visit?  No   CCM required this visit?  No      Plan:     I have personally reviewed and noted the following in the patient's chart:   Medical and social history Use of alcohol, tobacco or illicit drugs  Current medications and supplements including opioid prescriptions. Patient is not currently taking opioid prescriptions. Functional ability and status Nutritional status Physical activity Advanced directives List of other physicians Hospitalizations, surgeries, and ER visits in previous 12 months Vitals Screenings to include cognitive, depression, and falls Referrals and appointments  In addition, I have reviewed and discussed with patient certain preventive protocols, quality metrics, and best practice recommendations. A written personalized care plan for preventive services as well as general preventive health recommendations were provided to patient.     Criselda Peaches, LPN   [phone]   Nurse Notes: Patient due Diabetic kidney evaluation-Urine ACR

## 2022-12-06 ENCOUNTER — Ambulatory Visit
Admission: RE | Admit: 2022-12-06 | Discharge: 2022-12-06 | Disposition: A | Discharge status: Home / Self Care | Payer: Medicare Other | Source: Ambulatory Visit | Attending: Obstetrics and Gynecology | Admitting: Obstetrics and Gynecology

## 2022-12-06 DIAGNOSIS — N6320 Unspecified lump in the left breast, unspecified quadrant: Secondary | ICD-10-CM

## 2022-12-06 DIAGNOSIS — R928 Other abnormal and inconclusive findings on diagnostic imaging of breast: Secondary | ICD-10-CM | POA: Diagnosis not present

## 2022-12-06 DIAGNOSIS — N6012 Diffuse cystic mastopathy of left breast: Secondary | ICD-10-CM | POA: Diagnosis not present

## 2022-12-07 ENCOUNTER — Other Ambulatory Visit: Payer: Self-pay | Admitting: Adult Health

## 2022-12-13 ENCOUNTER — Other Ambulatory Visit: Payer: Self-pay | Admitting: Adult Health

## 2022-12-14 ENCOUNTER — Telehealth: Payer: Self-pay | Admitting: Adult Health

## 2022-12-14 NOTE — Telephone Encounter (Signed)
Tried to call pt to advised that Rx was already refilled on 12/08/2022. Pt will need to reach out to pharmacy to get a refill.

## 2022-12-14 NOTE — Telephone Encounter (Signed)
Pt has cpe sch for aug 6 and would like a refill on glipiZIDE (GLUCOTROL XL) 5 MG 24 hr tablet  [id number] - [address], [address] AT [address] [place] Phone: [phone]  Fax: [phone]

## 2022-12-14 NOTE — Telephone Encounter (Signed)
Pt is aware rx was refill on 3-14 and pt said pharm never called and she will reach out to [facility]

## 2023-01-01 ENCOUNTER — Encounter: Payer: Self-pay | Admitting: Adult Health

## 2023-01-03 ENCOUNTER — Other Ambulatory Visit: Payer: Self-pay | Admitting: Adult Health

## 2023-01-20 ENCOUNTER — Other Ambulatory Visit: Payer: Self-pay | Admitting: Obstetrics and Gynecology

## 2023-01-20 DIAGNOSIS — N6320 Unspecified lump in the left breast, unspecified quadrant: Secondary | ICD-10-CM

## 2023-02-02 ENCOUNTER — Other Ambulatory Visit: Payer: Self-pay | Admitting: Adult Health

## 2023-02-10 ENCOUNTER — Encounter: Payer: Self-pay | Admitting: Adult Health

## 2023-02-10 NOTE — Telephone Encounter (Signed)
Please advise 

## 2023-02-20 ENCOUNTER — Other Ambulatory Visit: Payer: Self-pay | Admitting: Adult Health

## 2023-03-29 ENCOUNTER — Other Ambulatory Visit: Payer: Self-pay | Admitting: Adult Health

## 2023-04-11 DIAGNOSIS — D045 Carcinoma in situ of skin of trunk: Secondary | ICD-10-CM | POA: Diagnosis not present

## 2023-04-11 DIAGNOSIS — D1801 Hemangioma of skin and subcutaneous tissue: Secondary | ICD-10-CM | POA: Diagnosis not present

## 2023-04-11 DIAGNOSIS — L570 Actinic keratosis: Secondary | ICD-10-CM | POA: Diagnosis not present

## 2023-04-11 DIAGNOSIS — L821 Other seborrheic keratosis: Secondary | ICD-10-CM | POA: Diagnosis not present

## 2023-04-11 DIAGNOSIS — D225 Melanocytic nevi of trunk: Secondary | ICD-10-CM | POA: Diagnosis not present

## 2023-04-11 DIAGNOSIS — Z85828 Personal history of other malignant neoplasm of skin: Secondary | ICD-10-CM | POA: Diagnosis not present

## 2023-04-11 DIAGNOSIS — L814 Other melanin hyperpigmentation: Secondary | ICD-10-CM | POA: Diagnosis not present

## 2023-05-02 ENCOUNTER — Encounter: Payer: Self-pay | Admitting: Adult Health

## 2023-05-02 ENCOUNTER — Ambulatory Visit (INDEPENDENT_AMBULATORY_CARE_PROVIDER_SITE_OTHER): Payer: Medicare Other | Admitting: Adult Health

## 2023-05-02 VITALS — BP 110/82 | HR 94 | Temp 98.2°F | Ht 61.75 in | Wt 223.0 lb

## 2023-05-02 DIAGNOSIS — E039 Hypothyroidism, unspecified: Secondary | ICD-10-CM | POA: Diagnosis not present

## 2023-05-02 DIAGNOSIS — I10 Essential (primary) hypertension: Secondary | ICD-10-CM

## 2023-05-02 DIAGNOSIS — E119 Type 2 diabetes mellitus without complications: Secondary | ICD-10-CM

## 2023-05-02 DIAGNOSIS — E782 Mixed hyperlipidemia: Secondary | ICD-10-CM

## 2023-05-02 DIAGNOSIS — M810 Age-related osteoporosis without current pathological fracture: Secondary | ICD-10-CM | POA: Diagnosis not present

## 2023-05-02 LAB — CBC WITH DIFFERENTIAL/PLATELET
Basophils Absolute: 0.1 10*3/uL (ref 0.0–0.1)
Basophils Relative: 1 % (ref 0.0–3.0)
Eosinophils Absolute: 0.1 10*3/uL (ref 0.0–0.7)
Eosinophils Relative: 0.9 % (ref 0.0–5.0)
HCT: 38.8 % (ref 36.0–46.0)
Hemoglobin: 12.7 g/dL (ref 12.0–15.0)
Lymphocytes Relative: 22.8 % (ref 12.0–46.0)
Lymphs Abs: 1.9 10*3/uL (ref 0.7–4.0)
MCHC: 32.8 g/dL (ref 30.0–36.0)
MCV: 99 fl (ref 78.0–100.0)
Monocytes Absolute: 0.5 10*3/uL (ref 0.1–1.0)
Monocytes Relative: 6.6 % (ref 3.0–12.0)
Neutro Abs: 5.6 10*3/uL (ref 1.4–7.7)
Neutrophils Relative %: 68.7 % (ref 43.0–77.0)
Platelets: 380 10*3/uL (ref 150.0–400.0)
RBC: 3.92 Mil/uL (ref 3.87–5.11)
RDW: 14.7 % (ref 11.5–15.5)
WBC: 8.1 10*3/uL (ref 4.0–10.5)

## 2023-05-02 LAB — COMPREHENSIVE METABOLIC PANEL
ALT: 32 U/L (ref 0–35)
AST: 32 U/L (ref 0–37)
Albumin: 4.3 g/dL (ref 3.5–5.2)
Alkaline Phosphatase: 66 U/L (ref 39–117)
BUN: 28 mg/dL — ABNORMAL HIGH (ref 6–23)
CO2: 26 mEq/L (ref 19–32)
Calcium: 10.4 mg/dL (ref 8.4–10.5)
Chloride: 104 mEq/L (ref 96–112)
Creatinine, Ser: 1.01 mg/dL (ref 0.40–1.20)
GFR: 53.82 mL/min — ABNORMAL LOW (ref >60.00)
Glucose, Bld: 136 mg/dL — ABNORMAL HIGH (ref 70–99)
Potassium: 4.2 mEq/L (ref 3.5–5.1)
Sodium: 142 mEq/L (ref 135–145)
Total Bilirubin: 0.5 mg/dL (ref 0.2–1.2)
Total Protein: 7.3 g/dL (ref 6.0–8.3)

## 2023-05-02 LAB — LIPID PANEL
Cholesterol: 157 mg/dL (ref 0–200)
HDL: 53.6 mg/dL (ref >39.00)
LDL Cholesterol: 77 mg/dL (ref 0–99)
NonHDL: 103.58
Total CHOL/HDL Ratio: 3
Triglycerides: 131 mg/dL (ref 0.0–149.0)
VLDL: 26.2 mg/dL (ref 0.0–40.0)

## 2023-05-02 LAB — MICROALBUMIN / CREATININE URINE RATIO
Creatinine,U: 161.3 mg/dL
Microalb Creat Ratio: 2.3 mg/g (ref 0.0–30.0)
Microalb, Ur: 3.6 mg/dL — ABNORMAL HIGH (ref 0.0–1.9)

## 2023-05-02 LAB — VITAMIN D 25 HYDROXY (VIT D DEFICIENCY, FRACTURES): VITD: 55.75 ng/mL (ref 30.00–100.00)

## 2023-05-02 LAB — TSH: TSH: 2.18 u[IU]/mL (ref 0.35–5.50)

## 2023-05-02 LAB — HEMOGLOBIN A1C: Hgb A1c MFr Bld: 6.8 % — ABNORMAL HIGH (ref 4.6–6.5)

## 2023-05-02 NOTE — Progress Notes (Signed)
Subjective:    Patient ID: Caroline Suarez, female    DOB: 09-12-1946, 77 y.o.   MRN: [id number]  HPI Patient presents for yearly preventative medicine examination. She is a pleasant 77 year old female who  has a past medical history of Arthritis, Asthma, Chicken pox, Heart murmur, Hyperlipidemia, Hypertension, Hypothyroidism, Osteoporosis, and Positive TB test.  Diabetes Mellitus -currently managed with glipizide 5 mg extended release twice daily, at this time her A1c had increased from baseline 6.1-7.2. She has been working on reducing carbs and and is having a smoothie before going to bed. Her blood sugars have been very well controlled with fasting glucose levels in the 100's . Lab Results  Component Value Date   HGBA1C 6.5 (A) 08/02/2022   Hypertension-well-controlled with lisinopril 30 mg daily.  She denies dizziness, lightheadedness, chest pain, or shortness of breath BP Readings from Last 3 Encounters:  05/02/23 110/82  08/02/22 130/80  04/29/22 130/82    Hyperlipidemia - managed with lipitor 10 mg daily. She denies myalgia or fatigue  Lab Results  Component Value Date   CHOL 227 (H) 04/29/2022   HDL 63.00 04/29/2022   LDLCALC 125 (H) 04/29/2022   TRIG 194.0 (H) 04/29/2022   CHOLHDL 4 04/29/2022    Hypothyroidism - takes synthroid 50 mcg daily  Lab Results  Component Value Date   TSH 1.63 04/29/2022    Osteoporosis/osteopenia -managed with Fosamax 70 mg weekly. Her last bone density screen was done in 2022 which showed osteopenia. She does take vitamin D 1000 units daily. She has her bone density screen done at her GYN office.    All immunizations and health maintenance protocols were reviewed with the patient and needed orders were placed.  Appropriate screening laboratory values were ordered for the patient including screening of hyperlipidemia, renal function and hepatic function.  Medication reconciliation,  past medical history, social history, problem list and  allergies were reviewed in detail with the patient  Goals were established with regard to weight loss, exercise, and  diet in compliance with medications.  Wt Readings from Last 3 Encounters:  05/02/23 223 lb (101.2 kg)  12/02/22 233 lb (105.7 kg)  08/02/22 233 lb (105.7 kg)    Review of Systems  Constitutional: Negative.   HENT: Negative.    Eyes: Negative.   Respiratory: Negative.    Cardiovascular: Negative.   Gastrointestinal: Negative.   Endocrine: Negative.   Genitourinary: Negative.   Musculoskeletal: Negative.   Skin: Negative.   Allergic/Immunologic: Negative.   Neurological: Negative.   Hematological: Negative.   Psychiatric/Behavioral: Negative.     Past Medical History:  Diagnosis Date   Arthritis    Asthma    Chicken pox    Heart murmur    Hyperlipidemia    Hypertension    Hypothyroidism    Osteoporosis    Positive TB test     Social History   Socioeconomic History   Marital status: Widowed    Spouse name: Not on file   Number of children: Not on file   Years of education: Not on file   Highest education level: Bachelor's degree (e.g., BA, AB, BS)  Occupational History   Not on file  Tobacco Use   Smoking status: Never   Smokeless tobacco: Never  Vaping Use   Vaping status: Never Used  Substance and Sexual Activity   Alcohol use: No   Drug use: No   Sexual activity: Not on file  Other Topics Concern   Not on  file  Social History Narrative   Retired Charity fundraiser    Social Determinants of Corporate investment banker Strain: Low Risk  (12/02/2022)   Overall Financial Resource Strain (CARDIA)    Difficulty of Paying Living Expenses: Not hard at all  Food Insecurity: No Food Insecurity (12/02/2022)   Hunger Vital Sign    Worried About Running Out of Food in the Last Year: Never true    Ran Out of Food in the Last Year: Never true  Transportation Needs: No Transportation Needs (12/02/2022)   PRAPARE - Administrator, Civil Service (Medical):  No    Lack of Transportation (Non-Medical): No  Physical Activity: Sufficiently Active (12/02/2022)   Exercise Vital Sign    Days of Exercise per Week: 4 days    Minutes of Exercise per Session: 40 min  Stress: No Stress Concern Present (12/02/2022)   [employer] of Occupational Health - Occupational Stress Questionnaire    Feeling of Stress : Not at all  Social Connections: Moderately Integrated (12/02/2022)   Social Connection and Isolation Panel [NHANES]    Frequency of Communication with Friends and Family: More than three times a week    Frequency of Social Gatherings with Friends and Family: More than three times a week    Attends Religious Services: More than 4 times per year    Active Member of [employer] or Organizations: Yes    Attends Banker Meetings: More than 4 times per year    Marital Status: Widowed  Intimate Partner Violence: Not At Risk (12/02/2022)   Humiliation, Afraid, Rape, and Kick questionnaire    Fear of Current or Ex-Partner: No    Emotionally Abused: No    Physically Abused: No    Sexually Abused: No    Past Surgical History:  Procedure Laterality Date   BREAST BIOPSY     multiple. None came back positive    FOOT SURGERY Left 2002    Family History  Problem Relation Age of Onset   Heart disease Mother    Stroke Mother    Hyperlipidemia Mother    Pulmonary fibrosis Father    BRCA 1/2 Sister    Breast cancer Maternal Aunt     Allergies  Allergen Reactions   Codeine Nausea And Vomiting   Erythromycin Nausea And Vomiting    Current Outpatient Medications on File Prior to Visit  Medication Sig Dispense Refill   albuterol (PROAIR HFA) 108 (90 Base) MCG/ACT inhaler Inhale 1-2 puffs into the lungs every 6 (six) hours as needed. 6.7 g 3   alendronate (FOSAMAX) 70 MG tablet TAKE 1 TABLET(70 MG) BY MOUTH 1 TIME A WEEK WITH A FULL GLASS OF WATER AND ON AN EMPTY STOMACH 12 tablet 1   atorvastatin (LIPITOR) 20 MG tablet Take 1 tablet (20 mg  total) by mouth daily. 90 tablet 3   Cetirizine HCl (ZYRTEC ALLERGY PO) 10 mg daily.     glipiZIDE (GLUCOTROL XL) 5 MG 24 hr tablet TAKE 1 TABLET(5 MG) BY MOUTH IN THE MORNING AND AT BEDTIME 180 tablet 0   levothyroxine (SYNTHROID) 50 MCG tablet TAKE 1 TABLET(50 MCG) BY MOUTH DAILY BEFORE BREAKFAST 90 tablet 0   lisinopril (ZESTRIL) 30 MG tablet TAKE 1 TABLET(30 MG) BY MOUTH DAILY 90 tablet 3   valACYclovir (VALTREX) 1000 MG tablet TAKE 2 TABLETS BY MOUTH TWICE DAILY 90 tablet 3   Wheat Dextrin (BENEFIBER PO) Take by mouth.     No current facility-administered medications on file  prior to visit.    BP 110/82   Pulse 94   Temp 98.2 F (36.8 C) (Oral)   Ht 5' 1.75" (1.568 m)   Wt 223 lb (101.2 kg)   SpO2 98%   BMI 41.12 kg/m       Objective:   Physical Exam Vitals and nursing note reviewed.  Constitutional:      General: She is not in acute distress.    Appearance: Normal appearance. She is obese. She is not ill-appearing.  HENT:     Head: Normocephalic and atraumatic.     Right Ear: Tympanic membrane, ear canal and external ear normal. There is no impacted cerumen.     Left Ear: Tympanic membrane, ear canal and external ear normal. There is no impacted cerumen.     Nose: Nose normal. No congestion or rhinorrhea.     Mouth/Throat:     Mouth: Mucous membranes are moist.     Pharynx: Oropharynx is clear.  Eyes:     Extraocular Movements: Extraocular movements intact.     Conjunctiva/sclera: Conjunctivae normal.     Pupils: Pupils are equal, round, and reactive to light.  Neck:     Vascular: No carotid bruit.  Cardiovascular:     Rate and Rhythm: Normal rate and regular rhythm.     Pulses: Normal pulses.     Heart sounds: No murmur heard.    No friction rub. No gallop.  Pulmonary:     Effort: Pulmonary effort is normal.     Breath sounds: Normal breath sounds.  Abdominal:     General: Abdomen is flat. Bowel sounds are normal. There is no distension.     Palpations:  Abdomen is soft. There is no mass.     Tenderness: There is no abdominal tenderness. There is no guarding or rebound.     Hernia: No hernia is present.  Musculoskeletal:        General: Normal range of motion.     Cervical back: Normal range of motion and neck supple.  Lymphadenopathy:     Cervical: No cervical adenopathy.  Skin:    General: Skin is warm and dry.     Capillary Refill: Capillary refill takes less than 2 seconds.  Neurological:     General: No focal deficit present.     Mental Status: She is alert and oriented to person, place, and time.  Psychiatric:        Mood and Affect: Mood normal.        Behavior: Behavior normal.        Thought Content: Thought content normal.        Judgment: Judgment normal.        Assessment & Plan:  1. Type 2 diabetes mellitus without complication, without long-term current use of insulin (HCC) - Consider dose change of synthroid  - Follow up in 3-6 months depending on A1c - keep work on weight loss through diet and exercise  - CBC with Differential/Platelet; Future - Comprehensive metabolic panel; Future - Lipid panel; Future - TSH; Future - Hemoglobin A1c; Future - Microalbumin/Creatinine Ratio, Urine; Future  2. Essential hypertension - Well controlled. No change in medication  - CBC with Differential/Platelet; Future - Comprehensive metabolic panel; Future - Lipid panel; Future - TSH; Future  3. Hypothyroidism, unspecified type - Consider dose change of synthroid  - CBC with Differential/Platelet; Future - Comprehensive metabolic panel; Future - Lipid panel; Future - TSH; Future  4. Osteoporosis without current pathological fracture, unspecified  osteoporosis type - Follow up with GYN for bone density  - CBC with Differential/Platelet; Future - Comprehensive metabolic panel; Future - Lipid panel; Future - TSH; Future - VITAMIN D 25 Hydroxy (Vit-D Deficiency, Fractures); Future  5. Mixed hyperlipidemia - Consider  increase in statin  - CBC with Differential/Platelet; Future - Comprehensive metabolic panel; Future - Lipid panel; Future - TSH; Future  Shirline Frees, NP

## 2023-05-02 NOTE — Patient Instructions (Signed)
It was great seeing you today   We will follow up with you regarding your lab work   Please let me know if you need anything   

## 2023-05-03 ENCOUNTER — Other Ambulatory Visit: Payer: Self-pay | Admitting: Adult Health

## 2023-05-03 DIAGNOSIS — I10 Essential (primary) hypertension: Secondary | ICD-10-CM

## 2023-05-03 MED ORDER — LISINOPRIL 30 MG PO TABS
30.0000 mg | ORAL_TABLET | Freq: Every day | ORAL | 3 refills | Status: DC
Start: 2023-05-03 — End: 2024-04-22

## 2023-05-03 MED ORDER — GLIPIZIDE ER 5 MG PO TB24: 5.0000 mg | ORAL_TABLET | Freq: Every day | ORAL | 1 refills | Status: DC

## 2023-05-03 MED ORDER — VALACYCLOVIR HCL 1 G PO TABS: ORAL_TABLET | ORAL | 3 refills | Status: AC

## 2023-05-22 ENCOUNTER — Ambulatory Visit: Admission: RE | Admit: 2023-05-22 | Payer: Medicare Other | Source: Ambulatory Visit

## 2023-05-22 ENCOUNTER — Ambulatory Visit
Admission: RE | Admit: 2023-05-22 | Discharge: 2023-05-22 | Disposition: A | Discharge status: Home / Self Care | Payer: Medicare Other | Source: Ambulatory Visit | Attending: Obstetrics and Gynecology | Admitting: Obstetrics and Gynecology

## 2023-05-22 DIAGNOSIS — N6320 Unspecified lump in the left breast, unspecified quadrant: Secondary | ICD-10-CM

## 2023-05-22 DIAGNOSIS — N6325 Unspecified lump in the left breast, overlapping quadrants: Secondary | ICD-10-CM | POA: Diagnosis not present

## 2023-05-24 DIAGNOSIS — Z01419 Encounter for gynecological examination (general) (routine) without abnormal findings: Secondary | ICD-10-CM | POA: Diagnosis not present

## 2023-05-24 DIAGNOSIS — N958 Other specified menopausal and perimenopausal disorders: Secondary | ICD-10-CM | POA: Diagnosis not present

## 2023-05-24 DIAGNOSIS — Z6841 Body Mass Index (BMI) 40.0 and over, adult: Secondary | ICD-10-CM | POA: Diagnosis not present

## 2023-05-24 DIAGNOSIS — M8588 Other specified disorders of bone density and structure, other site: Secondary | ICD-10-CM | POA: Diagnosis not present

## 2023-06-04 ENCOUNTER — Other Ambulatory Visit: Payer: Self-pay | Admitting: Adult Health

## 2023-06-16 DIAGNOSIS — M47816 Spondylosis without myelopathy or radiculopathy, lumbar region: Secondary | ICD-10-CM | POA: Diagnosis not present

## 2023-06-16 DIAGNOSIS — M1711 Unilateral primary osteoarthritis, right knee: Secondary | ICD-10-CM | POA: Diagnosis not present

## 2023-06-28 ENCOUNTER — Other Ambulatory Visit: Payer: Self-pay | Admitting: Adult Health

## 2023-07-12 DIAGNOSIS — M47816 Spondylosis without myelopathy or radiculopathy, lumbar region: Secondary | ICD-10-CM | POA: Diagnosis not present

## 2023-07-12 DIAGNOSIS — M1711 Unilateral primary osteoarthritis, right knee: Secondary | ICD-10-CM | POA: Diagnosis not present

## 2023-08-01 ENCOUNTER — Other Ambulatory Visit: Payer: Self-pay | Admitting: Family Medicine

## 2023-10-03 ENCOUNTER — Telehealth: Payer: Self-pay

## 2023-10-03 ENCOUNTER — Other Ambulatory Visit: Payer: Self-pay

## 2023-10-03 MED ORDER — LEVOTHYROXINE SODIUM 50 MCG PO TABS: ORAL_TABLET | ORAL | 0 refills | Status: DC

## 2023-10-03 NOTE — Telephone Encounter (Signed)
 RX refilled

## 2023-10-03 NOTE — Telephone Encounter (Signed)
 Copied from CRM [id number]. Topic: Clinical - Medication Refill >> Oct 03, 2023  9:52 AM Rosina BIRCH wrote: Most Recent Primary Care Visit:  Provider: MERNA HUXLEY  Department: [facility]  Visit Type: PHYSICAL  Date: 05/02/2023  Medication: levothyroxine   Has the patient contacted their pharmacy? Yes (Agent: If no, request that the patient contact the pharmacy for the refill. If patient does not wish to contact the pharmacy document the reason why and proceed with request.) (Agent: If yes, when and what did the pharmacy advise?)  Is this the correct pharmacy for this prescription? Yes If no, delete pharmacy and type the correct one.  This is the patient's preferred pharmacy:  [id number] [place], [place] - [address] AT [facility] [address] & [facility] [address] [place] [address] Phone: [phone] Fax: [phone]   Has the prescription been filled recently? No  Is the patient out of the medication? Yes  Has the patient been seen for an appointment in the last year OR does the patient have an upcoming appointment? Yes  Can we respond through MyChart? Yes  Agent: Please be advised that Rx refills may take up to 3 business days. We ask that you follow-up with your pharmacy.

## 2023-10-04 DIAGNOSIS — M170 Bilateral primary osteoarthritis of knee: Secondary | ICD-10-CM | POA: Diagnosis not present

## 2023-10-24 ENCOUNTER — Encounter: Payer: Self-pay | Admitting: Adult Health

## 2023-10-24 DIAGNOSIS — Z961 Presence of intraocular lens: Secondary | ICD-10-CM | POA: Diagnosis not present

## 2023-10-24 DIAGNOSIS — H52203 Unspecified astigmatism, bilateral: Secondary | ICD-10-CM | POA: Diagnosis not present

## 2023-10-24 DIAGNOSIS — E119 Type 2 diabetes mellitus without complications: Secondary | ICD-10-CM | POA: Diagnosis not present

## 2023-10-24 LAB — HM DIABETES EYE EXAM

## 2023-11-02 ENCOUNTER — Encounter: Payer: Self-pay | Admitting: Adult Health

## 2023-11-02 ENCOUNTER — Ambulatory Visit: Payer: Medicare Other | Admitting: Adult Health

## 2023-11-02 ENCOUNTER — Telehealth: Payer: Self-pay | Admitting: Adult Health

## 2023-11-02 VITALS — BP 120/80 | HR 82 | Temp 98.1°F | Ht 61.75 in | Wt 226.0 lb

## 2023-11-02 DIAGNOSIS — E119 Type 2 diabetes mellitus without complications: Secondary | ICD-10-CM | POA: Diagnosis not present

## 2023-11-02 DIAGNOSIS — I10 Essential (primary) hypertension: Secondary | ICD-10-CM | POA: Diagnosis not present

## 2023-11-02 LAB — HEMOGLOBIN A1C: Hgb A1c MFr Bld: 8.1 % — ABNORMAL HIGH (ref 4.6–6.5)

## 2023-11-02 NOTE — Progress Notes (Signed)
 Subjective:    Patient ID: Caroline Suarez, female    DOB: 09-24-1946, 78 y.o.   MRN: [id number]  HPI 78 year old female who  has a past medical history of Arthritis, Asthma, Chicken pox, Heart murmur, Hyperlipidemia, Hypertension, Hypothyroidism, Osteoporosis, and Positive TB test.  She presents to the office today for 61-month follow-up regarding DM and HTN   Diabetes Mellitus -currently managed with glipizide  5 mg extended release twice daily, She has been working on reducing carbs and and is having a smoothie before going to bed. Her blood sugars have been very well controlled with fasting glucose levels in the 100's . She does stay active and tries to eat healthy  Lab Results  Component Value Date   HGBA1C 6.8 (H) 05/02/2023   HGBA1C 6.5 (A) 08/02/2022   HGBA1C 7.2 (H) 04/29/2022   Wt Readings from Last 3 Encounters:  11/02/23 226 lb (102.5 kg)  05/02/23 223 lb (101.2 kg)  12/02/22 233 lb (105.7 kg)   Hypertension-well-controlled with lisinopril  30 mg daily.  She denies dizziness, lightheadedness, chest pain, or shortness of breath  BP Readings from Last 3 Encounters:  11/02/23 120/80  05/02/23 110/82  08/02/22 130/80    Review of Systems See HPI   Past Medical History:  Diagnosis Date   Arthritis    Asthma    Chicken pox    Heart murmur    Hyperlipidemia    Hypertension    Hypothyroidism    Osteoporosis    Positive TB test     Social History   Socioeconomic History   Marital status: Widowed    Spouse name: Not on file   Number of children: Not on file   Years of education: Not on file   Highest education level: Associate degree: academic program  Occupational History   Not on file  Tobacco Use   Smoking status: Never   Smokeless tobacco: Never  Vaping Use   Vaping status: Never Used  Substance and Sexual Activity   Alcohol use: No   Drug use: No   Sexual activity: Not on file  Other Topics Concern   Not on file  Social History Narrative   Retired  [employer]    Social Drivers of Corporate Investment Banker Strain: Low Risk  (10/29/2023)   Overall Financial Resource Strain (CARDIA)    Difficulty of Paying Living Expenses: Not hard at all  Food Insecurity: No Food Insecurity (10/29/2023)   Hunger Vital Sign    Worried About Running Out of Food in the Last Year: Never true    Ran Out of Food in the Last Year: Never true  Transportation Needs: No Transportation Needs (10/29/2023)   PRAPARE - Administrator, Civil Service (Medical): No    Lack of Transportation (Non-Medical): No  Physical Activity: Inactive (10/29/2023)   Exercise Vital Sign    Days of Exercise per Week: 0 days    Minutes of Exercise per Session: 40 min  Stress: Stress Concern Present (10/29/2023)   [employer] of Occupational Health - Occupational Stress Questionnaire    Feeling of Stress : To some extent  Social Connections: Moderately Integrated (10/29/2023)   Social Connection and Isolation Panel [NHANES]    Frequency of Communication with Friends and Family: More than three times a week    Frequency of Social Gatherings with Friends and Family: Three times a week    Attends Religious Services: 1 to 4 times per year    Active  Member of Clubs or Organizations: Yes    Attends Engineer, Structural: More than 4 times per year    Marital Status: Widowed  Intimate Partner Violence: Not At Risk (12/02/2022)   Humiliation, Afraid, Rape, and Kick questionnaire    Fear of Current or Ex-Partner: No    Emotionally Abused: No    Physically Abused: No    Sexually Abused: No    Past Surgical History:  Procedure Laterality Date   BREAST BIOPSY     multiple. None came back positive    FOOT SURGERY Left 2002    Family History  Problem Relation Age of Onset   Heart disease Mother    Stroke Mother    Hyperlipidemia Mother    Pulmonary fibrosis Father    BRCA 1/2 Sister    Breast cancer Maternal Aunt     Allergies  Allergen Reactions   Codeine Nausea And  Vomiting   Erythromycin Nausea And Vomiting    Current Outpatient Medications on File Prior to Visit  Medication Sig Dispense Refill   albuterol  (PROAIR  HFA) 108 (90 Base) MCG/ACT inhaler Inhale 1-2 puffs into the lungs every 6 (six) hours as needed. 6.7 g 3   alendronate  (FOSAMAX ) 70 MG tablet TAKE 1 TABLET(70 MG) BY MOUTH 1 TIME A WEEK WITH A FULL GLASS OF WATER AND ON AN EMPTY STOMACH 12 tablet 1   atorvastatin  (LIPITOR) 20 MG tablet Take 1 tablet (20 mg total) by mouth daily. 90 tablet 3   Cetirizine HCl (ZYRTEC ALLERGY PO) 10 mg daily.     glipiZIDE  (GLUCOTROL  XL) 5 MG 24 hr tablet TAKE 1 TABLET(5 MG) BY MOUTH IN THE MORNING AND AT BEDTIME 180 tablet 1   levothyroxine  (SYNTHROID ) 50 MCG tablet TAKE 1 TABLET(50 MCG) BY MOUTH DAILY BEFORE BREAKFAST 90 tablet 0   lisinopril  (ZESTRIL ) 30 MG tablet Take 1 tablet (30 mg total) by mouth daily. 90 tablet 3   valACYclovir  (VALTREX ) 1000 MG tablet TAKE 2 TABLETS BY MOUTH TWICE DAILY 90 tablet 3   Wheat Dextrin (BENEFIBER PO) Take by mouth.     No current facility-administered medications on file prior to visit.    BP 120/80   Pulse 82   Temp 98.1 F (36.7 C) (Oral)   Ht 5' 1.75 (1.568 m)   Wt 226 lb (102.5 kg)   SpO2 96%   BMI 41.67 kg/m       Objective:   Physical Exam Vitals and nursing note reviewed.  Constitutional:      Appearance: Normal appearance.  Cardiovascular:     Rate and Rhythm: Normal rate and regular rhythm.     Pulses: Normal pulses.     Heart sounds: Normal heart sounds.  Pulmonary:     Effort: Pulmonary effort is normal.     Breath sounds: Normal breath sounds.  Musculoskeletal:        General: Normal range of motion.  Skin:    General: Skin is warm and dry.  Neurological:     General: No focal deficit present.     Mental Status: She is alert and oriented to person, place, and time.  Psychiatric:        Mood and Affect: Mood normal.        Behavior: Behavior normal.        Thought Content: Thought  content normal.        Judgment: Judgment normal.       Assessment & Plan:  1. Type 2 diabetes mellitus  without complication, without long-term current use of insulin (HCC) (Primary) - Consider increasing glipizide  - Continue to stay active and eat healthy - Follow up in six months or sooner if needed  - Hemoglobin A1c; Future  2. Essential hypertension - Well controlled.   Jermall Isaacson, NP

## 2023-11-04 ENCOUNTER — Other Ambulatory Visit: Payer: Self-pay | Admitting: Adult Health

## 2023-11-05 ENCOUNTER — Other Ambulatory Visit: Payer: Self-pay | Admitting: Adult Health

## 2023-11-13 NOTE — Telephone Encounter (Signed)
 Patient declined awv do not call

## 2023-11-24 IMAGING — DX DG CHEST 2V
2 series · 2 of 2 positions shown · non-contrast
Comparison: Chest x-ray 10/24/2018.

CLINICAL DATA: 75-year-old female with history of left-sided flank
pain.

EXAM:
CHEST - 2 VIEW

[chest pa]
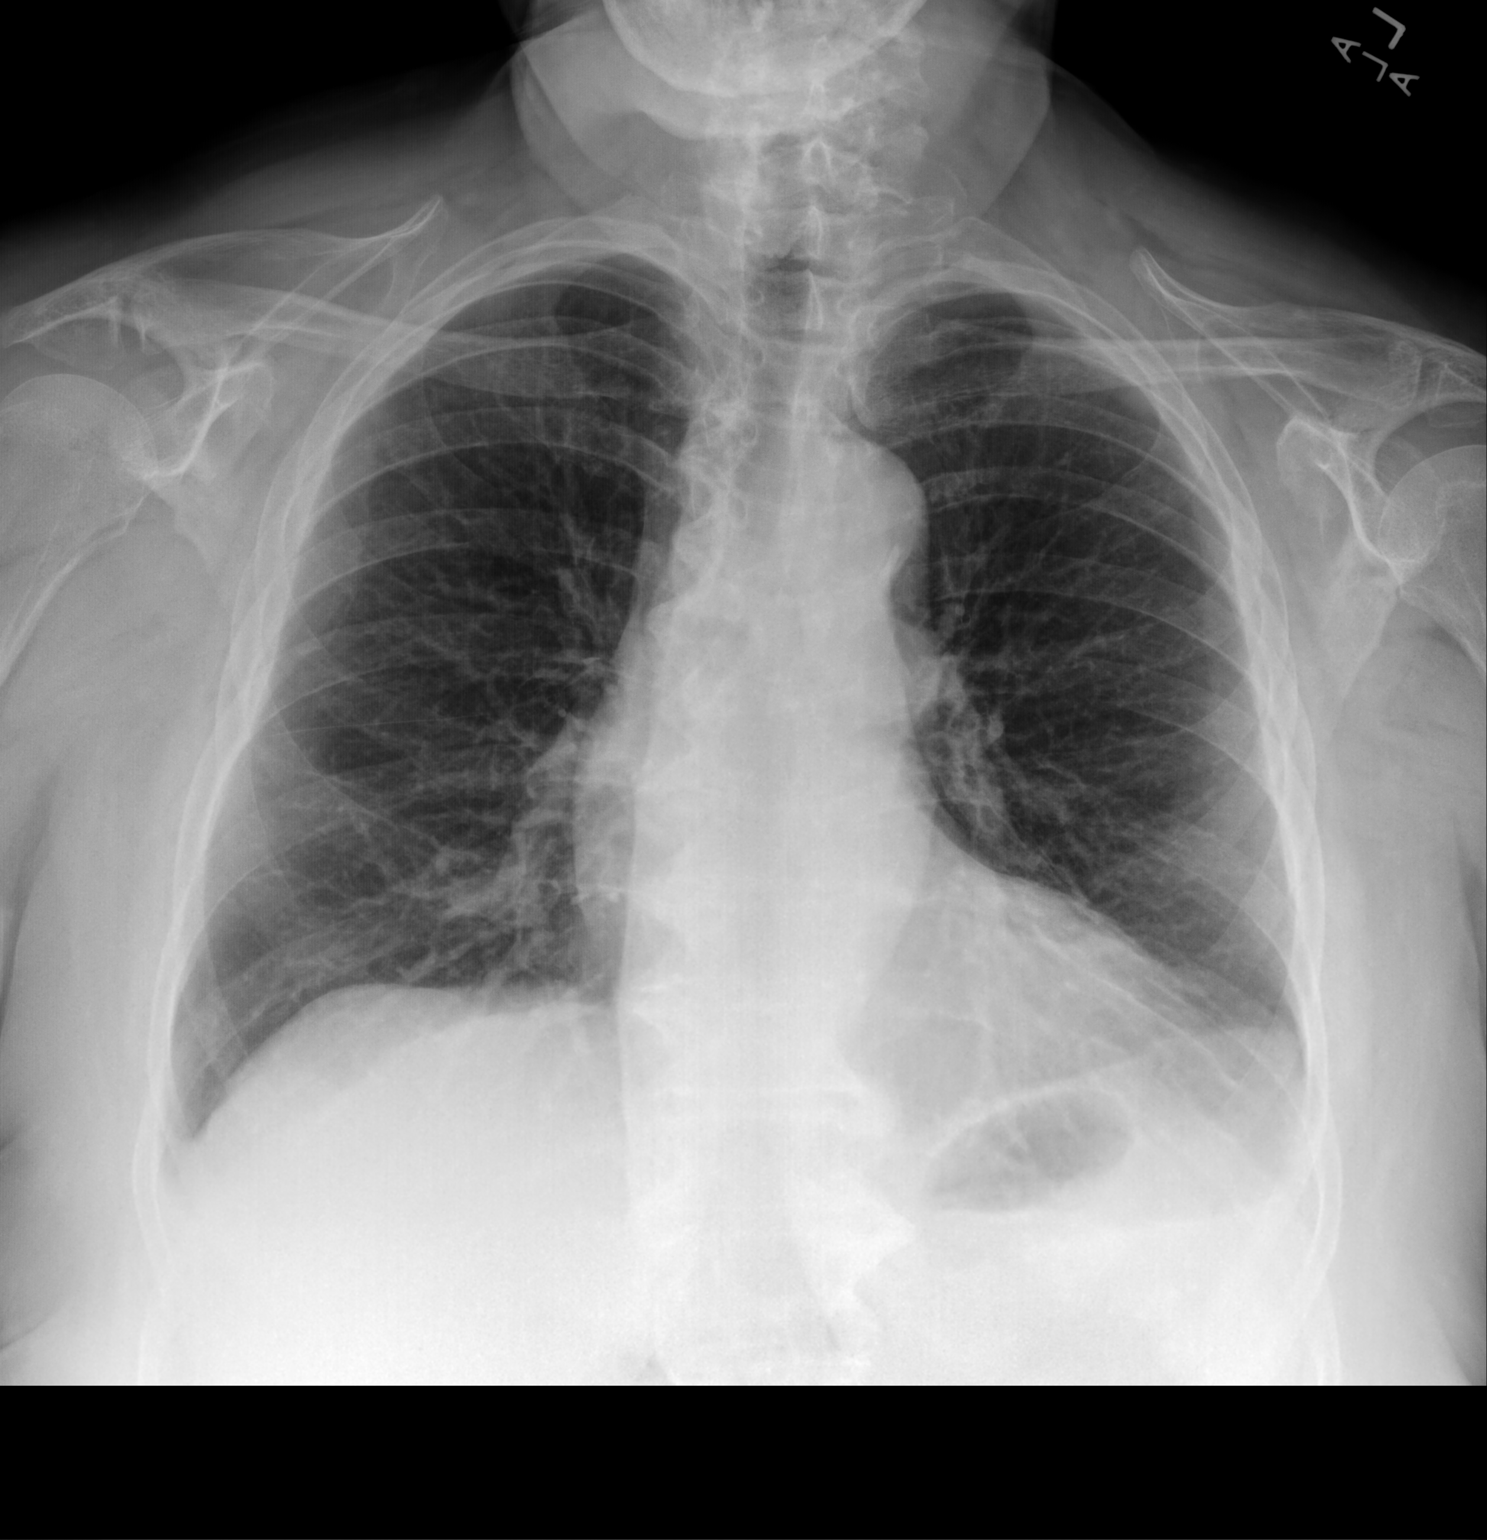

[chest lat]
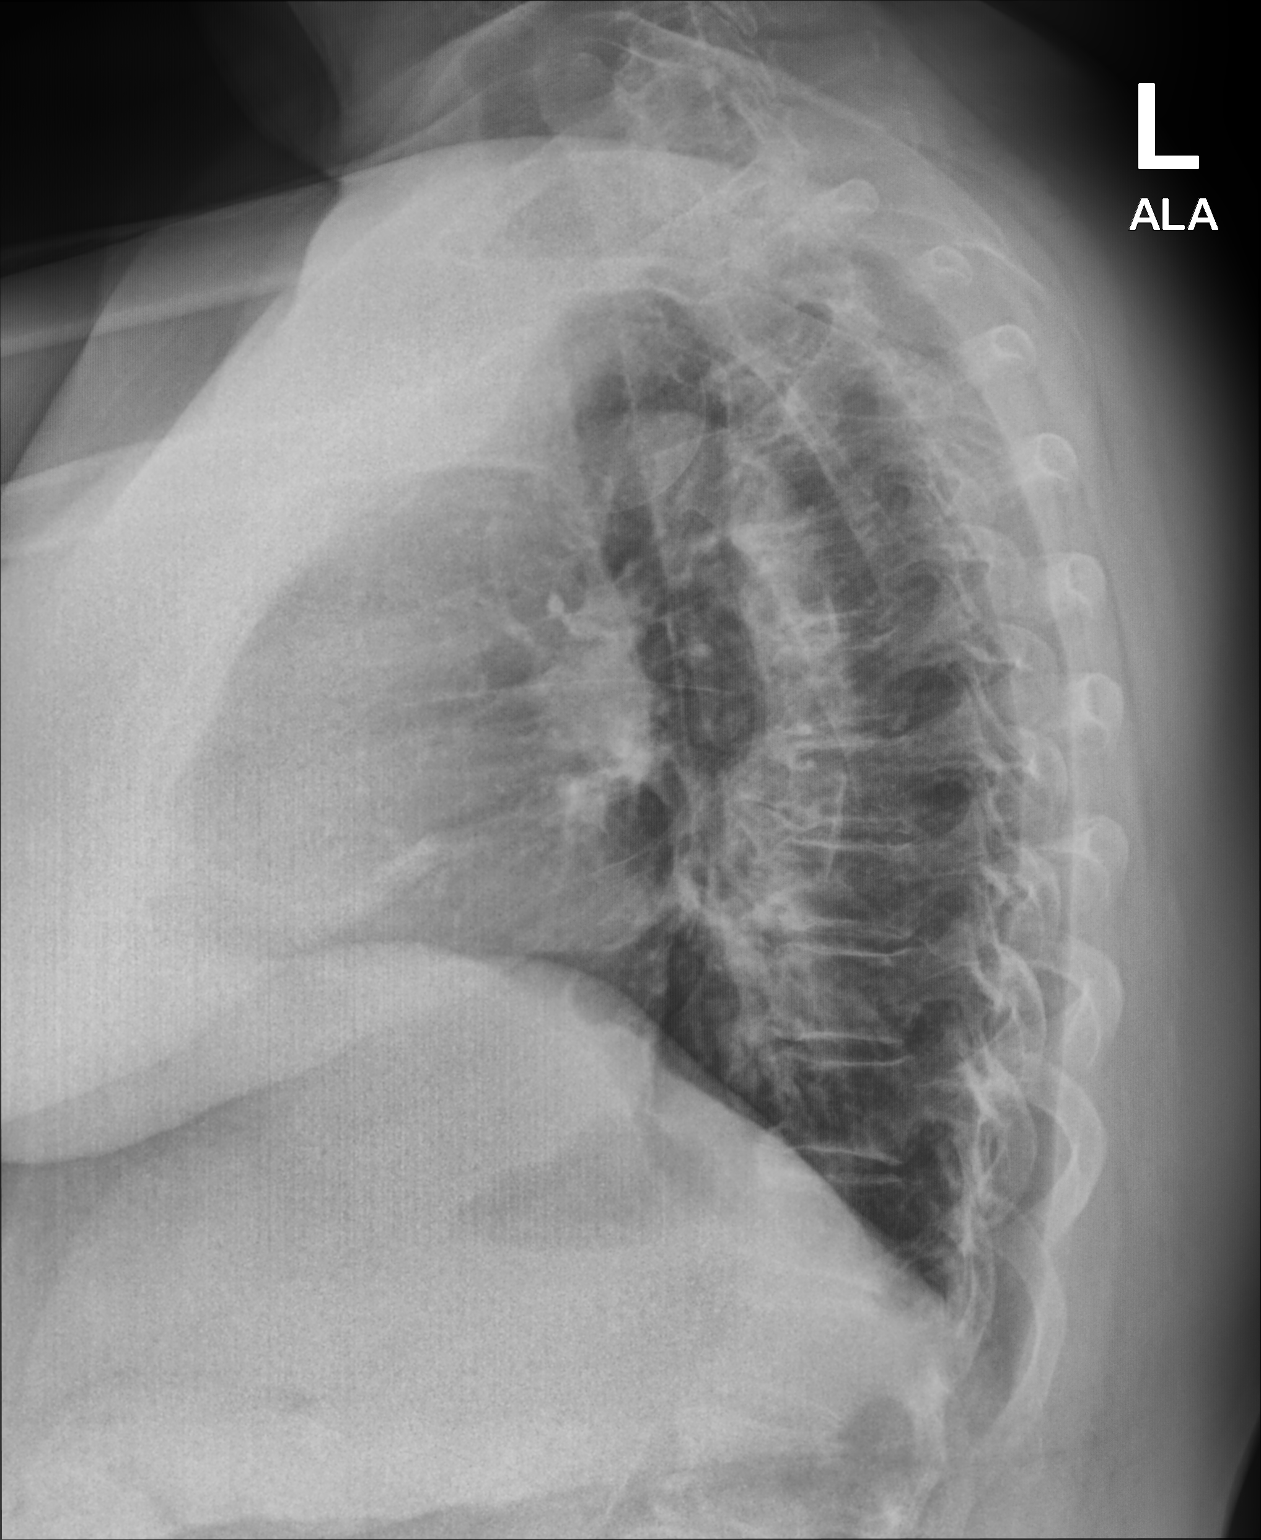

[2 of 2 positions shown; findings below may reference images not displayed]

FINDINGS: Lung volumes are normal. No consolidative airspace disease. No
pleural effusions. No pneumothorax. No pulmonary nodule or mass
noted. Pulmonary vasculature and the cardiomediastinal silhouette
are within normal limits. Atherosclerosis in the thoracic aorta.
IMPRESSION: No active cardiopulmonary disease.

## 2023-11-29 ENCOUNTER — Ambulatory Visit: Admitting: Adult Health

## 2023-11-30 ENCOUNTER — Encounter: Payer: Self-pay | Admitting: Adult Health

## 2023-11-30 ENCOUNTER — Ambulatory Visit: Admitting: Adult Health

## 2023-11-30 VITALS — BP 120/82 | HR 103 | Temp 97.8°F | Ht 61.0 in | Wt 223.0 lb

## 2023-11-30 DIAGNOSIS — S61211A Laceration without foreign body of left index finger without damage to nail, initial encounter: Secondary | ICD-10-CM | POA: Diagnosis not present

## 2023-11-30 NOTE — Progress Notes (Signed)
 Subjective:    Patient ID: Caroline Suarez, female    DOB: [birth year], 78 y.o.   MRN: [id number]  HPI 78 year old female who  has a past medical history of Arthritis, Asthma, Chicken pox, Heart murmur, Hyperlipidemia, Hypertension, Hypothyroidism, Osteoporosis, and Positive TB test.  She presents to the office today for an acute issue.  1 week ago she used a new knife that she had bought into [employer] for bread and accidentally cut Of her left pointer finger.  She does report excessive bleeding which she was able to control with pressure and bandages.  She has been keeping the wound clean and dry and would like for me to take a look at it.   She is up-to-date on tetanus shots  Review of Systems See HPI   Past Medical History:  Diagnosis Date   Arthritis    Asthma    Chicken pox    Heart murmur    Hyperlipidemia    Hypertension    Hypothyroidism    Osteoporosis    Positive TB test     Social History   Socioeconomic History   Marital status: Widowed    Spouse name: Not on file   Number of children: Not on file   Years of education: Not on file   Highest education level: Associate degree: academic program  Occupational History   Not on file  Tobacco Use   Smoking status: Never   Smokeless tobacco: Never  Vaping Use   Vaping status: Never Used  Substance and Sexual Activity   Alcohol use: No   Drug use: No   Sexual activity: Not on file  Other Topics Concern   Not on file  Social History Narrative   Retired Charity fundraiser    Social Drivers of Corporate investment banker Strain: Low Risk  (10/29/2023)   Overall Financial Resource Strain (CARDIA)    Difficulty of Paying Living Expenses: Not hard at all  Food Insecurity: No Food Insecurity (10/29/2023)   Hunger Vital Sign    Worried About Running Out of Food in the Last Year: Never true    Ran Out of Food in the Last Year: Never true  Transportation Needs: No Transportation Needs (10/29/2023)   PRAPARE - Scientist, research (physical sciences) (Medical): No    Lack of Transportation (Non-Medical): No  Physical Activity: Inactive (10/29/2023)   Exercise Vital Sign    Days of Exercise per Week: 0 days    Minutes of Exercise per Session: 40 min  Stress: Stress Concern Present (10/29/2023)   [employer] of Occupational Health - Occupational Stress Questionnaire    Feeling of Stress : To some extent  Social Connections: Moderately Integrated (10/29/2023)   Social Connection and Isolation Panel [NHANES]    Frequency of Communication with Friends and Family: More than three times a week    Frequency of Social Gatherings with Friends and Family: Three times a week    Attends Religious Services: 1 to 4 times per year    Active Member of Clubs or Organizations: Yes    Attends Banker Meetings: More than 4 times per year    Marital Status: Widowed  Intimate Partner Violence: Not At Risk (12/02/2022)   Humiliation, Afraid, Rape, and Kick questionnaire    Fear of Current or Ex-Partner: No    Emotionally Abused: No    Physically Abused: No    Sexually Abused: No    Past Surgical History:  Procedure  Laterality Date   BREAST BIOPSY     multiple. None came back positive    FOOT SURGERY Left 2002    Family History  Problem Relation Age of Onset   Heart disease Mother    Stroke Mother    Hyperlipidemia Mother    Pulmonary fibrosis Father    BRCA 1/2 Sister    Breast cancer Maternal Aunt     Allergies  Allergen Reactions   Codeine Nausea And Vomiting   Erythromycin Nausea And Vomiting    Current Outpatient Medications on File Prior to Visit  Medication Sig Dispense Refill   albuterol (PROAIR HFA) 108 (90 Base) MCG/ACT inhaler Inhale 1-2 puffs into the lungs every 6 (six) hours as needed. 6.7 g 3   alendronate (FOSAMAX) 70 MG tablet TAKE 1 TABLET(70 MG) BY MOUTH 1 TIME A WEEK WITH A FULL GLASS OF WATER AND ON AN EMPTY STOMACH 12 tablet 1   atorvastatin (LIPITOR) 20 MG tablet TAKE 1 TABLET(20 MG) BY  MOUTH DAILY 90 tablet 3   Cetirizine HCl (ZYRTEC ALLERGY PO) 10 mg daily.     glipiZIDE (GLUCOTROL XL) 5 MG 24 hr tablet TAKE 1 TABLET(5 MG) BY MOUTH IN THE MORNING AND AT BEDTIME 180 tablet 1   levothyroxine (SYNTHROID) 50 MCG tablet TAKE 1 TABLET(50 MCG) BY MOUTH DAILY BEFORE BREAKFAST 90 tablet 0   lisinopril (ZESTRIL) 30 MG tablet Take 1 tablet (30 mg total) by mouth daily. 90 tablet 3   valACYclovir (VALTREX) 1000 MG tablet TAKE 2 TABLETS BY MOUTH TWICE DAILY 90 tablet 3   Wheat Dextrin (BENEFIBER PO) Take by mouth.     No current facility-administered medications on file prior to visit.    BP 120/82   Pulse (!) 103   Temp 97.8 F (36.6 C) (Oral)   Ht 5\' 1"  (1.549 m)   Wt 223 lb (101.2 kg)   SpO2 97%   BMI 42.14 kg/m       Objective:   Physical Exam Vitals and nursing note reviewed.  Constitutional:      Appearance: Normal appearance.  Cardiovascular:     Rate and Rhythm: Normal rate and regular rhythm.     Pulses: Normal pulses.     Heart sounds: Normal heart sounds.  Pulmonary:     Effort: Pulmonary effort is normal.     Breath sounds: Normal breath sounds.  Musculoskeletal:        General: Normal range of motion.  Skin:    General: Skin is dry.     Findings: Laceration present.     Comments: She does have a laceration to the distal tip of her left index finger.  No nail involvement.  Wound appears without infection, no bleeding, no discharge noted  Neurological:     Mental Status: She is alert and oriented to person, place, and time.  Psychiatric:        Mood and Affect: Mood normal.        Behavior: Behavior normal.        Thought Content: Thought content normal.        Judgment: Judgment normal.       Assessment & Plan:  1. Laceration of left index finger without damage to nail, foreign body presence unspecified, initial encounter (Primary) -Reassurance given that she will make a full recovery. Does not appear infected. Keep area clean and dry  - Follow  up with signs of infection   Shirline Frees, NP

## 2023-12-08 ENCOUNTER — Other Ambulatory Visit: Payer: Self-pay

## 2023-12-08 ENCOUNTER — Telehealth: Payer: Self-pay | Admitting: *Deleted

## 2023-12-08 MED ORDER — GLIPIZIDE ER 5 MG PO TB24: ORAL_TABLET | ORAL | 1 refills | Status: DC

## 2023-12-08 NOTE — Telephone Encounter (Signed)
 Spoke to pt and she stated the Rx that she picked up says take once a day with 90 tabs. Called pharmacy and they stated that the Rx pt picked up was an script written in August 2024. According to the pharmacist pt sept. Script was not filled yet. I advised pharmacy to d/c any remaining once a day scripts. Also per pharmacist send in another script for pt and write " store on file" for the pharmacist to hold until pt runs out of the 90 day supply for insurance purposes. Pt notified of update and advised to call us back for refill if she has any issues.

## 2023-12-08 NOTE — Telephone Encounter (Signed)
 Copied from CRM [id number]. Topic: Clinical - Prescription Issue >> Dec 08, 2023  9:09 AM Fonda Kinder J wrote: Reason for CRM: Pt states her prescription was refilled for glipiZIDE (GLUCOTROL XL) 5 MG 24 hr table but the instructions say once a day versus twice a day that she usually takes, so she didn't receive her full prescription. Pt is requesting a callback

## 2023-12-28 ENCOUNTER — Other Ambulatory Visit: Payer: Self-pay | Admitting: Adult Health

## 2024-01-04 ENCOUNTER — Other Ambulatory Visit: Payer: Self-pay | Admitting: Adult Health

## 2024-01-31 ENCOUNTER — Ambulatory Visit (INDEPENDENT_AMBULATORY_CARE_PROVIDER_SITE_OTHER): Payer: Medicare Other | Admitting: Adult Health

## 2024-01-31 ENCOUNTER — Encounter: Payer: Self-pay | Admitting: Adult Health

## 2024-01-31 VITALS — BP 110/62 | HR 101 | Temp 98.1°F | Ht 61.0 in | Wt 223.0 lb

## 2024-01-31 DIAGNOSIS — E119 Type 2 diabetes mellitus without complications: Secondary | ICD-10-CM | POA: Diagnosis not present

## 2024-01-31 DIAGNOSIS — Z7984 Long term (current) use of oral hypoglycemic drugs: Secondary | ICD-10-CM

## 2024-01-31 DIAGNOSIS — I10 Essential (primary) hypertension: Secondary | ICD-10-CM

## 2024-01-31 LAB — POCT GLYCOSYLATED HEMOGLOBIN (HGB A1C): Hemoglobin A1C: 7.7 % — AB (ref 4.0–5.6)

## 2024-01-31 MED ORDER — GLIPIZIDE ER 10 MG PO TB24: 10.0000 mg | ORAL_TABLET | Freq: Every day | ORAL | 0 refills | Status: DC

## 2024-01-31 MED ORDER — GLIPIZIDE ER 10 MG PO TB24
10.0000 mg | ORAL_TABLET | Freq: Two times a day (BID) | ORAL | 0 refills | Status: DC
Start: 2024-01-31 — End: 2024-02-02

## 2024-01-31 NOTE — Progress Notes (Signed)
 Subjective:    Patient ID: Heddie Lively, female    DOB: 10-11-1945, 78 y.o.   MRN: [id number]  HPI 78 year old female who  has a past medical history of Arthritis, Asthma, Chicken pox, Heart murmur, Hyperlipidemia, Hypertension, Hypothyroidism, Osteoporosis, and Positive TB test.  Diabetes Mellitus -currently managed with glipizide  5 mg extended release twice daily, She has been working on reducing carbs and sugars.She does not check her blood sugars at home. Lab Results  Component Value Date   HGBA1C 7.7 (A) 01/31/2024   HGBA1C 8.1 (H) 11/02/2023   HGBA1C 6.8 (H) 05/02/2023   Hypertension-well-controlled with lisinopril  30 mg daily.  She denies dizziness, lightheadedness, chest pain, or shortness of breath BP Readings from Last 3 Encounters:  01/31/24 110/62  11/30/23 120/82  11/02/23 120/80    Review of Systems See HPI   Past Medical History:  Diagnosis Date   Arthritis    Asthma    Chicken pox    Heart murmur    Hyperlipidemia    Hypertension    Hypothyroidism    Osteoporosis    Positive TB test     Social History   Socioeconomic History   Marital status: Widowed    Spouse name: Not on file   Number of children: Not on file   Years of education: Not on file   Highest education level: Associate degree: academic program  Occupational History   Not on file  Tobacco Use   Smoking status: Never   Smokeless tobacco: Never  Vaping Use   Vaping status: Never Used  Substance and Sexual Activity   Alcohol use: No   Drug use: No   Sexual activity: Not on file  Other Topics Concern   Not on file  Social History Narrative   Retired Charity fundraiser    Social Drivers of Corporate investment banker Strain: Low Risk  (10/29/2023)   Overall Financial Resource Strain (CARDIA)    Difficulty of Paying Living Expenses: Not hard at all  Food Insecurity: No Food Insecurity (10/29/2023)   Hunger Vital Sign    Worried About Running Out of Food in the Last Year: Never true    Ran Out of  Food in the Last Year: Never true  Transportation Needs: No Transportation Needs (10/29/2023)   PRAPARE - Administrator, Civil Service (Medical): No    Lack of Transportation (Non-Medical): No  Physical Activity: Unknown (10/29/2023)   Exercise Vital Sign    Days of Exercise per Week: 0 days    Minutes of Exercise per Session: Not on file  Recent Concern: Physical Activity - Inactive (10/29/2023)   Exercise Vital Sign    Days of Exercise per Week: 0 days    Minutes of Exercise per Session: 40 min  Stress: Stress Concern Present (10/29/2023)   [employer] of Occupational Health - Occupational Stress Questionnaire    Feeling of Stress : To some extent  Social Connections: Moderately Integrated (10/29/2023)   Social Connection and Isolation Panel [NHANES]    Frequency of Communication with Friends and Family: More than three times a week    Frequency of Social Gatherings with Friends and Family: Three times a week    Attends Religious Services: 1 to 4 times per year    Active Member of Clubs or Organizations: Yes    Attends Banker Meetings: More than 4 times per year    Marital Status: Widowed  Intimate Partner Violence: Not At Risk (12/02/2022)  Humiliation, Afraid, Rape, and Kick questionnaire    Fear of Current or Ex-Partner: No    Emotionally Abused: No    Physically Abused: No    Sexually Abused: No    Past Surgical History:  Procedure Laterality Date   BREAST BIOPSY     multiple. None came back positive    FOOT SURGERY Left 2002    Family History  Problem Relation Age of Onset   Heart disease Mother    Stroke Mother    Hyperlipidemia Mother    Pulmonary fibrosis Father    BRCA 1/2 Sister    Breast cancer Maternal Aunt     Allergies  Allergen Reactions   Codeine Nausea And Vomiting   Erythromycin Nausea And Vomiting    Current Outpatient Medications on File Prior to Visit  Medication Sig Dispense Refill   albuterol  (PROAIR  HFA) 108 (90  Base) MCG/ACT inhaler Inhale 1-2 puffs into the lungs every 6 (six) hours as needed. 6.7 g 3   alendronate  (FOSAMAX ) 70 MG tablet TAKE 1 TABLET(70 MG) BY MOUTH 1 TIME A WEEK WITH A FULL GLASS OF WATER AND ON AN EMPTY STOMACH 12 tablet 1   atorvastatin  (LIPITOR) 20 MG tablet TAKE 1 TABLET(20 MG) BY MOUTH DAILY 90 tablet 3   Cetirizine HCl (ZYRTEC ALLERGY PO) 10 mg daily.     levothyroxine  (SYNTHROID ) 50 MCG tablet TAKE 1 TABLET(50 MCG) BY MOUTH DAILY BEFORE BREAKFAST 90 tablet 0   lisinopril  (ZESTRIL ) 30 MG tablet Take 1 tablet (30 mg total) by mouth daily. 90 tablet 3   valACYclovir  (VALTREX ) 1000 MG tablet TAKE 2 TABLETS BY MOUTH TWICE DAILY 90 tablet 3   Wheat Dextrin (BENEFIBER PO) Take by mouth.     No current facility-administered medications on file prior to visit.    BP 110/62   Pulse (!) 101   Temp 98.1 F (36.7 C) (Oral)   Ht 5\' 1"  (1.549 m)   Wt 223 lb (101.2 kg)   SpO2 96%   BMI 42.14 kg/m       Objective:   Physical Exam Vitals and nursing note reviewed.  Constitutional:      Appearance: Normal appearance.  Cardiovascular:     Rate and Rhythm: Normal rate and regular rhythm.     Pulses: Normal pulses.     Heart sounds: Normal heart sounds.  Pulmonary:     Effort: Pulmonary effort is normal.     Breath sounds: Normal breath sounds.  Musculoskeletal:        General: Normal range of motion.  Skin:    General: Skin is warm and dry.  Neurological:     General: No focal deficit present.     Mental Status: She is alert and oriented to person, place, and time.  Psychiatric:        Mood and Affect: Mood normal.        Behavior: Behavior normal.        Thought Content: Thought content normal.        Judgment: Judgment normal.       Assessment & Plan:  1. Diabetes mellitus treated with oral medication (HCC) (Primary)  - POC HgB A1c- 7.7 - improved but not at goal. Will increase Glipzide to 10 mg BID. Continue to cut back on carbs and sugars. Add in exercise  -  Will recheck at her CPE in 3 months  - glipiZIDE  (GLUCOTROL  XL) 10 MG 24 hr tablet; Take 1 tablet (10 mg total) by mouth  2 (two) times daily.  Dispense: 180 tablet; Refill: 0  2. Essential hypertension - Well controlled. No change in therapy   Yoshua Geisinger, NP

## 2024-02-02 ENCOUNTER — Ambulatory Visit: Payer: Self-pay | Admitting: *Deleted

## 2024-02-02 ENCOUNTER — Other Ambulatory Visit: Payer: Self-pay

## 2024-02-02 DIAGNOSIS — E119 Type 2 diabetes mellitus without complications: Secondary | ICD-10-CM

## 2024-02-02 MED ORDER — GLIPIZIDE ER 10 MG PO TB24: 10.0000 mg | ORAL_TABLET | Freq: Two times a day (BID) | ORAL | 0 refills | Status: DC

## 2024-02-02 NOTE — Telephone Encounter (Signed)
 First attempt to return her call.   Unable to leave a message.   Got a recording that "This call cannot be completed as dialed".    "Please hang up and try your call again later".   Will route to call back basket for further attempts.

## 2024-02-02 NOTE — Telephone Encounter (Signed)
 Spoke to pt and she stated no one has called her back. I advised that she would need an appt. Pt stated she will wait to see if her sx worsen. Pt advised to go to UC over the weekend. Pt verbalized understanding.

## 2024-02-02 NOTE — Telephone Encounter (Signed)
 Third attempt made to return her call.   Got recording "This call cnnot be completed at this time".    "Please try your call again later".   Per policy after 3 attempts I'm forwarding this to [facility] for [place], NP.

## 2024-02-02 NOTE — Telephone Encounter (Signed)
 Copied from CRM [id number]. Topic: Clinical - Medical Advice >> Feb 02, 2024  8:56 AM Caroline Suarez wrote: Reason for CRM: Pt says that her external part of her ears have started to ache. This has happened before. Wondering if Dr. Randel Buss can send over a rx for some ear drops.

## 2024-02-02 NOTE — Telephone Encounter (Signed)
 Second attempt to return her call.    "Your call is unable to be completed at this time".   Routing back to call back basket for further attempts.

## 2024-03-18 ENCOUNTER — Other Ambulatory Visit: Payer: Self-pay | Admitting: Obstetrics and Gynecology

## 2024-03-18 DIAGNOSIS — N6320 Unspecified lump in the left breast, unspecified quadrant: Secondary | ICD-10-CM

## 2024-03-30 ENCOUNTER — Other Ambulatory Visit: Payer: Self-pay | Admitting: Adult Health

## 2024-04-02 ENCOUNTER — Encounter: Payer: Self-pay | Admitting: Adult Health

## 2024-04-03 NOTE — Telephone Encounter (Signed)
**Note De-identified  [place] Obfuscation** Please advise 

## 2024-04-04 ENCOUNTER — Ambulatory Visit: Admitting: Adult Health

## 2024-04-09 ENCOUNTER — Ambulatory Visit: Admitting: Adult Health

## 2024-04-15 DIAGNOSIS — Z85828 Personal history of other malignant neoplasm of skin: Secondary | ICD-10-CM | POA: Diagnosis not present

## 2024-04-15 DIAGNOSIS — L570 Actinic keratosis: Secondary | ICD-10-CM | POA: Diagnosis not present

## 2024-04-15 DIAGNOSIS — D225 Melanocytic nevi of trunk: Secondary | ICD-10-CM | POA: Diagnosis not present

## 2024-04-15 DIAGNOSIS — L821 Other seborrheic keratosis: Secondary | ICD-10-CM | POA: Diagnosis not present

## 2024-04-15 DIAGNOSIS — L820 Inflamed seborrheic keratosis: Secondary | ICD-10-CM | POA: Diagnosis not present

## 2024-04-15 DIAGNOSIS — L814 Other melanin hyperpigmentation: Secondary | ICD-10-CM | POA: Diagnosis not present

## 2024-04-21 ENCOUNTER — Other Ambulatory Visit: Payer: Self-pay | Admitting: Adult Health

## 2024-04-21 DIAGNOSIS — I10 Essential (primary) hypertension: Secondary | ICD-10-CM

## 2024-05-01 ENCOUNTER — Encounter: Payer: Medicare Other | Admitting: Adult Health

## 2024-05-02 ENCOUNTER — Ambulatory Visit (INDEPENDENT_AMBULATORY_CARE_PROVIDER_SITE_OTHER): Admitting: Adult Health

## 2024-05-02 ENCOUNTER — Encounter: Payer: Self-pay | Admitting: Adult Health

## 2024-05-02 VITALS — BP 122/88 | HR 75 | Temp 98.1°F | Wt 217.0 lb

## 2024-05-02 DIAGNOSIS — E119 Type 2 diabetes mellitus without complications: Secondary | ICD-10-CM

## 2024-05-02 DIAGNOSIS — I10 Essential (primary) hypertension: Secondary | ICD-10-CM

## 2024-05-02 DIAGNOSIS — M810 Age-related osteoporosis without current pathological fracture: Secondary | ICD-10-CM | POA: Diagnosis not present

## 2024-05-02 DIAGNOSIS — R011 Cardiac murmur, unspecified: Secondary | ICD-10-CM

## 2024-05-02 DIAGNOSIS — E039 Hypothyroidism, unspecified: Secondary | ICD-10-CM | POA: Diagnosis not present

## 2024-05-02 DIAGNOSIS — Z7984 Long term (current) use of oral hypoglycemic drugs: Secondary | ICD-10-CM

## 2024-05-02 DIAGNOSIS — E782 Mixed hyperlipidemia: Secondary | ICD-10-CM | POA: Diagnosis not present

## 2024-05-02 LAB — COMPREHENSIVE METABOLIC PANEL WITH GFR
ALT: 22 U/L (ref 0–35)
AST: 24 U/L (ref 0–37)
Albumin: 4.4 g/dL (ref 3.5–5.2)
Alkaline Phosphatase: 67 U/L (ref 39–117)
BUN: 22 mg/dL (ref 6–23)
CO2: 23 meq/L (ref 19–32)
Calcium: 9.9 mg/dL (ref 8.4–10.5)
Chloride: 104 meq/L (ref 96–112)
Creatinine, Ser: 0.76 mg/dL (ref 0.40–1.20)
GFR: 75.18 mL/min (ref >60.00)
Glucose, Bld: 127 mg/dL — ABNORMAL HIGH (ref 70–99)
Potassium: 4 meq/L (ref 3.5–5.1)
Sodium: 144 meq/L (ref 135–145)
Total Bilirubin: 0.5 mg/dL (ref 0.2–1.2)
Total Protein: 7.5 g/dL (ref 6.0–8.3)

## 2024-05-02 LAB — LIPID PANEL
Cholesterol: 152 mg/dL (ref 0–200)
HDL: 57.9 mg/dL (ref >39.00)
LDL Cholesterol: 75 mg/dL (ref 0–99)
NonHDL: 93.97
Total CHOL/HDL Ratio: 3
Triglycerides: 97 mg/dL (ref 0.0–149.0)
VLDL: 19.4 mg/dL (ref 0.0–40.0)

## 2024-05-02 LAB — CBC WITH DIFFERENTIAL/PLATELET
Basophils Absolute: 0 K/uL (ref 0.0–0.1)
Basophils Relative: 0.6 % (ref 0.0–3.0)
Eosinophils Absolute: 0.1 K/uL (ref 0.0–0.7)
Eosinophils Relative: 2 % (ref 0.0–5.0)
HCT: 40.6 % (ref 36.0–46.0)
Hemoglobin: 13.5 g/dL (ref 12.0–15.0)
Lymphocytes Relative: 25.9 % (ref 12.0–46.0)
Lymphs Abs: 1.9 K/uL (ref 0.7–4.0)
MCHC: 33.2 g/dL (ref 30.0–36.0)
MCV: 95.9 fl (ref 78.0–100.0)
Monocytes Absolute: 0.5 K/uL (ref 0.1–1.0)
Monocytes Relative: 6.6 % (ref 3.0–12.0)
Neutro Abs: 4.7 K/uL (ref 1.4–7.7)
Neutrophils Relative %: 64.9 % (ref 43.0–77.0)
Platelets: 349 K/uL (ref 150.0–400.0)
RBC: 4.23 Mil/uL (ref 3.87–5.11)
RDW: 13.7 % (ref 11.5–15.5)
WBC: 7.2 K/uL (ref 4.0–10.5)

## 2024-05-02 LAB — HEMOGLOBIN A1C: Hgb A1c MFr Bld: 6.9 % — ABNORMAL HIGH (ref 4.6–6.5)

## 2024-05-02 LAB — TSH: TSH: 2.33 u[IU]/mL (ref 0.35–5.50)

## 2024-05-02 LAB — VITAMIN D 25 HYDROXY (VIT D DEFICIENCY, FRACTURES): VITD: 75.5 ng/mL (ref 30.00–100.00)

## 2024-05-02 MED ORDER — GLIPIZIDE ER 10 MG PO TB24
10.0000 mg | ORAL_TABLET | Freq: Every day | ORAL | 1 refills | Status: AC
Start: 2024-05-02 — End: 2024-08-02

## 2024-05-02 MED ORDER — ALENDRONATE SODIUM 70 MG PO TABS: ORAL_TABLET | ORAL | 1 refills | Status: AC

## 2024-05-02 NOTE — Progress Notes (Signed)
 Subjective:    Patient ID: Caroline Suarez, female    DOB: 09-14-1946, 78 y.o.   MRN: [id number]  HPI Patient presents for yearly preventative medicine examination. She is a pleasant 78 year old female who  has a past medical history of Arthritis, Asthma, Chicken pox, Heart murmur, Hyperlipidemia, Hypertension, Hypothyroidism, Osteoporosis, and Positive TB test.  Diabetes Mellitus -currently managed with glipizide  10 mg extended release daily. She has been working on reducing carbs and sugars.She does not check her blood sugars at home. She has started to use the Lingo CGM and has been able to tailor foods to her blood sugar levels. She has found this very helpful. Her CGM shows an average glucose of 106 with 88% of TIR Lab Results  Component Value Date   HGBA1C 7.7 (A) 01/31/2024   HGBA1C 8.1 (H) 11/02/2023   HGBA1C 6.8 (H) 05/02/2023   Hypertension-well-controlled with lisinopril  30 mg daily.  She denies dizziness, lightheadedness, chest pain, or shortness of breath BP Readings from Last 3 Encounters:  05/02/24 122/88  01/31/24 110/62  11/30/23 120/82   Hyperlipidemia - managed with lipitor 10 mg daily. She denies myalgia or fatigue  Lab Results  Component Value Date   CHOL 157 05/02/2023   HDL 53.60 05/02/2023   LDLCALC 77 05/02/2023   TRIG 131.0 05/02/2023   CHOLHDL 3 05/02/2023   Hypothyroidism - takes synthroid  50 mcg daily  Lab Results  Component Value Date   TSH 2.18 05/02/2023   Osteoporosis/osteopenia -managed with Fosamax  70 mg weekly. Her last bone density screen was done in 2024 ( I do not have these results) She does take vitamin D  1000 units daily. She has her bone density screen done at her GYN office.   All immunizations and health maintenance protocols were reviewed with the patient and needed orders were placed.  Appropriate screening laboratory values were ordered for the patient including screening of hyperlipidemia, renal function and hepatic  function.  Medication reconciliation,  past medical history, social history, problem list and allergies were reviewed in detail with the patient  Goals were established with regard to weight loss, exercise, and  diet in compliance with medications Wt Readings from Last 3 Encounters:  05/02/24 217 lb (98.4 kg)  01/31/24 223 lb (101.2 kg)  11/30/23 223 lb (101.2 kg)   Review of Systems  Constitutional: Negative.   HENT: Negative.    Eyes: Negative.   Respiratory: Negative.    Cardiovascular: Negative.   Gastrointestinal: Negative.   Endocrine: Negative.   Genitourinary: Negative.   Musculoskeletal:  Positive for gait problem.  Skin: Negative.   Allergic/Immunologic: Negative.   Hematological: Negative.   Psychiatric/Behavioral: Negative.     Past Medical History:  Diagnosis Date   Arthritis    Asthma    Chicken pox    Heart murmur    Hyperlipidemia    Hypertension    Hypothyroidism    Osteoporosis    Positive TB test     Social History   Socioeconomic History   Marital status: Widowed    Spouse name: Not on file   Number of children: Not on file   Years of education: Not on file   Highest education level: Bachelor's degree (e.g., BA, AB, BS)  Occupational History   Not on file  Tobacco Use   Smoking status: Never   Smokeless tobacco: Never  Vaping Use   Vaping status: Never Used  Substance and Sexual Activity   Alcohol use: No   Drug  use: No   Sexual activity: Not on file  Other Topics Concern   Not on file  Social History Narrative   Retired Charity fundraiser    Social Drivers of Corporate investment banker Strain: Low Risk  (04/30/2024)   Overall Financial Resource Strain (CARDIA)    Difficulty of Paying Living Expenses: Not hard at all  Food Insecurity: No Food Insecurity (04/30/2024)   Hunger Vital Sign    Worried About Running Out of Food in the Last Year: Never true    Ran Out of Food in the Last Year: Never true  Transportation Needs: No Transportation Needs  (04/30/2024)   PRAPARE - Administrator, Civil Service (Medical): No    Lack of Transportation (Non-Medical): No  Physical Activity: Inactive (04/30/2024)   Exercise Vital Sign    Days of Exercise per Week: 0 days    Minutes of Exercise per Session: Not on file  Stress: No Stress Concern Present (04/30/2024)   [employer] of Occupational Health - Occupational Stress Questionnaire    Feeling of Stress: Only a little  Social Connections: Moderately Integrated (04/30/2024)   Social Connection and Isolation Panel    Frequency of Communication with Friends and Family: More than three times a week    Frequency of Social Gatherings with Friends and Family: More than three times a week    Attends Religious Services: 1 to 4 times per year    Active Member of [employer] or Organizations: Yes    Attends Banker Meetings: More than 4 times per year    Marital Status: Widowed  Intimate Partner Violence: Not At Risk (12/02/2022)   Humiliation, Afraid, Rape, and Kick questionnaire    Fear of Current or Ex-Partner: No    Emotionally Abused: No    Physically Abused: No    Sexually Abused: No    Past Surgical History:  Procedure Laterality Date   BREAST BIOPSY     multiple. None came back positive    FOOT SURGERY Left 2002    Family History  Problem Relation Age of Onset   Heart disease Mother    Stroke Mother    Hyperlipidemia Mother    Pulmonary fibrosis Father    BRCA 1/2 Sister    Breast cancer Maternal Aunt     Allergies  Allergen Reactions   Codeine Nausea And Vomiting   Erythromycin Nausea And Vomiting    Current Outpatient Medications on File Prior to Visit  Medication Sig Dispense Refill   albuterol  (PROAIR  HFA) 108 (90 Base) MCG/ACT inhaler Inhale 1-2 puffs into the lungs every 6 (six) hours as needed. 6.7 g 3   alendronate  (FOSAMAX ) 70 MG tablet TAKE 1 TABLET(70 MG) BY MOUTH 1 TIME A WEEK WITH A FULL GLASS OF WATER AND ON AN EMPTY STOMACH 12 tablet 1    atorvastatin  (LIPITOR) 20 MG tablet TAKE 1 TABLET(20 MG) BY MOUTH DAILY 90 tablet 3   Cetirizine HCl (ZYRTEC ALLERGY PO) 10 mg daily.     glipiZIDE  (GLUCOTROL  XL) 10 MG 24 hr tablet Take 1 tablet (10 mg total) by mouth 2 (two) times daily. (Patient taking differently: Take 10 mg by mouth daily.) 180 tablet 0   levothyroxine  (SYNTHROID ) 50 MCG tablet TAKE 1 TABLET(50 MCG) BY MOUTH DAILY BEFORE BREAKFAST 90 tablet 0   lisinopril  (ZESTRIL ) 30 MG tablet TAKE 1 TABLET(30 MG) BY MOUTH DAILY 90 tablet 3   valACYclovir  (VALTREX ) 1000 MG tablet TAKE 2 TABLETS BY MOUTH TWICE DAILY  90 tablet 3   Wheat Dextrin (BENEFIBER PO) Take by mouth.     No current facility-administered medications on file prior to visit.    BP 122/88   Pulse 75   Temp 98.1 F (36.7 C) (Oral)   Wt 217 lb (98.4 kg)   SpO2 97%   BMI 41.00 kg/m       Objective:   Physical Exam Vitals and nursing note reviewed.  Constitutional:      General: She is not in acute distress.    Appearance: Normal appearance. She is not ill-appearing.  HENT:     Head: Normocephalic and atraumatic.     Right Ear: Tympanic membrane, ear canal and external ear normal. There is no impacted cerumen.     Left Ear: Tympanic membrane, ear canal and external ear normal. There is no impacted cerumen.     Nose: Nose normal. No congestion or rhinorrhea.     Mouth/Throat:     Mouth: Mucous membranes are moist.     Pharynx: Oropharynx is clear.  Eyes:     Extraocular Movements: Extraocular movements intact.     Conjunctiva/sclera: Conjunctivae normal.     Pupils: Pupils are equal, round, and reactive to light.  Neck:     Vascular: No carotid bruit.  Cardiovascular:     Rate and Rhythm: Normal rate and regular rhythm.     Pulses: Normal pulses.     Heart sounds: Murmur (heard best at left sternal boarder) heard.     No friction rub. No gallop.  Pulmonary:     Effort: Pulmonary effort is normal.     Breath sounds: Normal breath sounds.  Abdominal:      General: Abdomen is flat. Bowel sounds are normal. There is no distension.     Palpations: Abdomen is soft. There is no mass.     Tenderness: There is no abdominal tenderness. There is no guarding or rebound.     Hernia: No hernia is present.  Musculoskeletal:        General: Normal range of motion.     Cervical back: Normal range of motion and neck supple.  Lymphadenopathy:     Cervical: No cervical adenopathy.  Skin:    General: Skin is warm and dry.     Capillary Refill: Capillary refill takes less than 2 seconds.  Neurological:     General: No focal deficit present.     Mental Status: She is alert and oriented to person, place, and time.     Gait: Gait abnormal (steady gait with cane).  Psychiatric:        Mood and Affect: Mood normal.        Behavior: Behavior normal.        Thought Content: Thought content normal.        Judgment: Judgment normal.       Assessment & Plan:  1. Diabetes mellitus treated with oral medication (HCC) (Primary) - Continue with Glipizide  10 mg ER daily. I am hopeful that we can decrease her dose as her diet is improving - Follow up in 3 months  - CBC with Differential/Platelet; Future - Comprehensive metabolic panel with GFR; Future - Lipid panel; Future - TSH; Future - Hemoglobin A1c; Future - glipiZIDE  (GLUCOTROL  XL) 10 MG 24 hr tablet; Take 1 tablet (10 mg total) by mouth daily.  Dispense: 90 tablet; Refill: 1 - Microalbumin/Creatinine Ratio, Urine; Future  2. Essential hypertension - Well controlled. No change in medication  - CBC with  Differential/Platelet; Future - Comprehensive metabolic panel with GFR; Future - Lipid panel; Future - TSH; Future  3. Mixed hyperlipidemia - Consider dose change  - CBC with Differential/Platelet; Future - Comprehensive metabolic panel with GFR; Future - Lipid panel; Future - TSH; Future  4. Hypothyroidism, unspecified type - Consider dose change  - CBC with Differential/Platelet; Future -  Comprehensive metabolic panel with GFR; Future - Lipid panel; Future - TSH; Future  5. Osteoporosis without current pathological fracture, unspecified osteoporosis type  - VITAMIN D  25 Hydroxy (Vit-D Deficiency, Fractures); Future - alendronate  (FOSAMAX ) 70 MG tablet; TAKE 1 TABLET(70 MG) BY MOUTH 1 TIME A WEEK WITH A FULL GLASS OF WATER AND ON AN EMPTY STOMACH  Dispense: 12 tablet; Refill: 1  6. Heart murmur - She does report having an echo in the past when she was living in [place]  but this was more than 8 years ago. She denies CP or SOB but has very intermittent episodes of feeling lightheaded when she is walking. This may happen multiple months apart.  - ECHOCARDIOGRAM COMPLETE; Future  Darleene Shape, NP

## 2024-05-03 ENCOUNTER — Ambulatory Visit: Payer: Self-pay | Admitting: Adult Health

## 2024-05-03 ENCOUNTER — Other Ambulatory Visit: Payer: Self-pay | Admitting: Medical Genetics

## 2024-05-03 LAB — MICROALBUMIN / CREATININE URINE RATIO
Creatinine,U: 148.7 mg/dL
Microalb Creat Ratio: 36.2 mg/g — ABNORMAL HIGH (ref 0.0–30.0)
Microalb, Ur: 5.4 mg/dL — ABNORMAL HIGH (ref 0.0–1.9)

## 2024-05-22 ENCOUNTER — Other Ambulatory Visit

## 2024-05-22 DIAGNOSIS — Z006 Encounter for examination for normal comparison and control in clinical research program: Secondary | ICD-10-CM

## 2024-05-23 ENCOUNTER — Ambulatory Visit
Admission: RE | Admit: 2024-05-23 | Discharge: 2024-05-23 | Disposition: A | Discharge status: Home / Self Care | Source: Ambulatory Visit | Attending: Obstetrics and Gynecology | Admitting: Obstetrics and Gynecology

## 2024-05-23 DIAGNOSIS — N6002 Solitary cyst of left breast: Secondary | ICD-10-CM | POA: Diagnosis not present

## 2024-05-23 DIAGNOSIS — N6324 Unspecified lump in the left breast, lower inner quadrant: Secondary | ICD-10-CM | POA: Diagnosis not present

## 2024-05-23 DIAGNOSIS — N6320 Unspecified lump in the left breast, unspecified quadrant: Secondary | ICD-10-CM

## 2024-05-24 DIAGNOSIS — Z6839 Body mass index (BMI) 39.0-39.9, adult: Secondary | ICD-10-CM | POA: Diagnosis not present

## 2024-05-24 DIAGNOSIS — Z01419 Encounter for gynecological examination (general) (routine) without abnormal findings: Secondary | ICD-10-CM | POA: Diagnosis not present

## 2024-05-29 ENCOUNTER — Ambulatory Visit (INDEPENDENT_AMBULATORY_CARE_PROVIDER_SITE_OTHER)

## 2024-05-29 DIAGNOSIS — R011 Cardiac murmur, unspecified: Secondary | ICD-10-CM | POA: Diagnosis not present

## 2024-05-29 LAB — ECHOCARDIOGRAM COMPLETE
AR max vel: 1.65 cm2
AV Area VTI: 1.55 cm2
AV Area mean vel: 1.58 cm2
AV Mean grad: 9 mmHg
AV Peak grad: 17.5 mmHg
Ao pk vel: 2.09 m/s
Area-P 1/2: 3.65 cm2

## 2024-05-31 LAB — GENECONNECT MOLECULAR SCREEN: Genetic Analysis Overall Interpretation: NEGATIVE

## 2024-06-08 ENCOUNTER — Encounter: Payer: Self-pay | Admitting: Adult Health

## 2024-06-11 ENCOUNTER — Encounter: Payer: Self-pay | Admitting: Adult Health

## 2024-06-11 MED ORDER — COVID-19 MRNA VACC (MODERNA) 50 MCG/0.5ML IM SUSP: 0.5000 mL | Freq: Once | INTRAMUSCULAR | 0 refills | Status: AC

## 2024-07-05 ENCOUNTER — Other Ambulatory Visit: Payer: Self-pay | Admitting: Adult Health

## 2024-07-08 ENCOUNTER — Other Ambulatory Visit: Payer: Self-pay | Admitting: Adult Health

## 2024-08-02 ENCOUNTER — Ambulatory Visit: Admitting: Adult Health

## 2024-08-02 VITALS — BP 110/82 | HR 87 | Temp 98.2°F | Ht 61.0 in | Wt 214.0 lb

## 2024-08-02 DIAGNOSIS — I10 Essential (primary) hypertension: Secondary | ICD-10-CM

## 2024-08-02 DIAGNOSIS — E119 Type 2 diabetes mellitus without complications: Secondary | ICD-10-CM | POA: Diagnosis not present

## 2024-08-02 DIAGNOSIS — Z7984 Long term (current) use of oral hypoglycemic drugs: Secondary | ICD-10-CM

## 2024-08-02 LAB — POCT GLYCOSYLATED HEMOGLOBIN (HGB A1C): Hemoglobin A1C: 6.5 % — AB (ref 4.0–5.6)

## 2024-08-02 NOTE — Progress Notes (Signed)
 Subjective:    Patient ID: Caroline Suarez, female    DOB: 04-06-1946, 78 y.o.   MRN: [id number]  HPI 78 year old female who  has a past medical history of Arthritis, Asthma, Chicken pox, Heart murmur, Hyperlipidemia, Hypertension, Hypothyroidism, Osteoporosis, and Positive TB test.  She presents to the office today for follow up regarding DM and HTN   Diabetes Mellitus -currently managed with glipizide  10 mg extended release daily. She has been working on reducing carbs and sugars.She does not check her blood sugars at home. She has started to use the [employer] CGM and has been able to tailor foods to her blood sugar levels. She has found this very helpful. Her CGM shows an average glucose of 106 with 88% of TIR Lab Results  Component Value Date   HGBA1C 6.5 (A) 08/02/2024   HGBA1C 6.9 (H) 05/02/2024   HGBA1C 7.7 (A) 01/31/2024   Hypertension-well-controlled with lisinopril  30 mg daily.  She denies dizziness, lightheadedness, chest pain, or shortness of breath BP Readings from Last 3 Encounters:  08/02/24 110/82  05/02/24 122/88  01/31/24 110/62    Review of Systems See HPI   Past Medical History:  Diagnosis Date   Arthritis    Asthma    Chicken pox    Heart murmur    Hyperlipidemia    Hypertension    Hypothyroidism    Osteoporosis    Positive TB test     Social History   Socioeconomic History   Marital status: Widowed    Spouse name: Not on file   Number of children: Not on file   Years of education: Not on file   Highest education level: Associate degree: occupational, scientist, product/process development, or vocational program  Occupational History   Not on file  Tobacco Use   Smoking status: Never   Smokeless tobacco: Never  Vaping Use   Vaping status: Never Used  Substance and Sexual Activity   Alcohol use: No   Drug use: No   Sexual activity: Not on file  Other Topics Concern   Not on file  Social History Narrative   Retired [employer]    Social Drivers of Corporate Investment Banker  Strain: Low Risk  (07/29/2024)   Overall Financial Resource Strain (CARDIA)    Difficulty of Paying Living Expenses: Not hard at all  Food Insecurity: No Food Insecurity (07/29/2024)   Hunger Vital Sign    Worried About Running Out of Food in the Last Year: Never true    Ran Out of Food in the Last Year: Never true  Transportation Needs: No Transportation Needs (07/29/2024)   PRAPARE - Administrator, Civil Service (Medical): No    Lack of Transportation (Non-Medical): No  Physical Activity: Inactive (07/29/2024)   Exercise Vital Sign    Days of Exercise per Week: 0 days    Minutes of Exercise per Session: Not on file  Stress: Stress Concern Present (07/29/2024)   [employer] of Occupational Health - Occupational Stress Questionnaire    Feeling of Stress: To some extent  Social Connections: Moderately Integrated (07/29/2024)   Social Connection and Isolation Panel    Frequency of Communication with Friends and Family: More than three times a week    Frequency of Social Gatherings with Friends and Family: Three times a week    Attends Religious Services: 1 to 4 times per year    Active Member of Clubs or Organizations: Yes    Attends Banker Meetings:  More than 4 times per year    Marital Status: Widowed  Intimate Partner Violence: Not At Risk (12/02/2022)   Humiliation, Afraid, Rape, and Kick questionnaire    Fear of Current or Ex-Partner: No    Emotionally Abused: No    Physically Abused: No    Sexually Abused: No    Past Surgical History:  Procedure Laterality Date   BREAST BIOPSY     multiple. None came back positive    FOOT SURGERY Left 2002    Family History  Problem Relation Age of Onset   Heart disease Mother    Stroke Mother    Hyperlipidemia Mother    Pulmonary fibrosis Father    BRCA 1/2 Sister    Breast cancer Maternal Aunt     Allergies  Allergen Reactions   Codeine Nausea And Vomiting   Erythromycin Nausea And Vomiting     Current Outpatient Medications on File Prior to Visit  Medication Sig Dispense Refill   albuterol  (PROAIR  HFA) 108 (90 Base) MCG/ACT inhaler Inhale 1-2 puffs into the lungs every 6 (six) hours as needed. 6.7 g 3   alendronate  (FOSAMAX ) 70 MG tablet TAKE 1 TABLET(70 MG) BY MOUTH 1 TIME A WEEK WITH A FULL GLASS OF WATER AND ON AN EMPTY STOMACH 12 tablet 1   atorvastatin  (LIPITOR) 20 MG tablet TAKE 1 TABLET(20 MG) BY MOUTH DAILY 90 tablet 3   Cetirizine HCl (ZYRTEC ALLERGY PO) 10 mg daily.     glipiZIDE  (GLUCOTROL  XL) 10 MG 24 hr tablet Take 1 tablet (10 mg total) by mouth daily. 90 tablet 1   levothyroxine  (SYNTHROID ) 50 MCG tablet TAKE 1 TABLET(50 MCG) BY MOUTH DAILY BEFORE BREAKFAST 90 tablet 3   lisinopril  (ZESTRIL ) 30 MG tablet TAKE 1 TABLET(30 MG) BY MOUTH DAILY 90 tablet 3   valACYclovir  (VALTREX ) 1000 MG tablet TAKE 2 TABLETS BY MOUTH TWICE DAILY 90 tablet 3   Wheat Dextrin (BENEFIBER PO) Take by mouth.     No current facility-administered medications on file prior to visit.    BP 110/82   Pulse 87   Temp 98.2 F (36.8 C) (Oral)   Ht 5' 1 (1.549 m)   Wt 214 lb (97.1 kg)   SpO2 94%   BMI 40.43 kg/m       Objective:   Physical Exam Vitals and nursing note reviewed.  Constitutional:      Appearance: Normal appearance. She is obese.  Cardiovascular:     Rate and Rhythm: Normal rate and regular rhythm.     Pulses: Normal pulses.     Heart sounds: Normal heart sounds.  Pulmonary:     Effort: Pulmonary effort is normal.     Breath sounds: Normal breath sounds.  Skin:    General: Skin is warm and dry.  Neurological:     General: No focal deficit present.     Mental Status: She is alert and oriented to person, place, and time.  Psychiatric:        Mood and Affect: Mood normal.        Behavior: Behavior normal.        Thought Content: Thought content normal.        Judgment: Judgment normal.        Assessment & Plan:  1. Diabetes mellitus treated with oral  medication (HCC) (Primary)  - POC HgB A1c - 6.5  - At goal. Continue with Glipizide  10 mg ER daily  - Follow up in 6 months  2. Essential hypertension - Controlled. No change in medication   Darleene Shape, NP

## 2024-08-02 NOTE — Patient Instructions (Signed)
 Health Maintenance Due  Topic Date Due   Medicare Annual Wellness (AWV)  12/02/2023   Influenza Vaccine  04/26/2024   COVID-19 Vaccine (9 - 2025-26 season) 05/27/2024       05/02/2024    9:30 AM 05/02/2023   10:03 AM 12/02/2022    2:12 PM  Depression screen PHQ 2/9  Decreased Interest 0 0 0  Down, Depressed, Hopeless 0 0 0  PHQ - 2 Score 0 0 0  Altered sleeping  0   Tired, decreased energy  0   Change in appetite  0   Feeling bad or failure about yourself   0   Trouble concentrating  0   Moving slowly or fidgety/restless  0   Suicidal thoughts  0   PHQ-9 Score  0    Difficult doing work/chores  Not difficult at all      Data saved with a previous flowsheet row definition

## 2025-01-30 ENCOUNTER — Ambulatory Visit: Admitting: Adult Health
